# Patient Record
Sex: Female | Born: 1995
Health system: Southern US, Community
[De-identification: ages and names within clinical notes are randomized; demographics above are authoritative.]

## PROBLEM LIST (undated history)

## (undated) ENCOUNTER — Inpatient Hospital Stay (HOSPITAL_COMMUNITY): Payer: Self-pay

---

## 2016-04-09 LAB — OB RESULTS CONSOLE ABO/RH: RH TYPE: POSITIVE

## 2016-04-09 LAB — OB RESULTS CONSOLE RPR: RPR: NONREACTIVE

## 2016-04-09 LAB — OB RESULTS CONSOLE HIV ANTIBODY (ROUTINE TESTING): HIV: NONREACTIVE

## 2016-04-09 LAB — OB RESULTS CONSOLE GC/CHLAMYDIA
Chlamydia: NEGATIVE
Gonorrhea: NEGATIVE

## 2016-04-09 LAB — OB RESULTS CONSOLE RUBELLA ANTIBODY, IGM: Rubella: IMMUNE

## 2016-04-09 LAB — OB RESULTS CONSOLE ANTIBODY SCREEN: Antibody Screen: NEGATIVE

## 2016-04-09 LAB — OB RESULTS CONSOLE HEPATITIS B SURFACE ANTIGEN: HEP B S AG: NEGATIVE

## 2016-06-09 DIAGNOSIS — Z3201 Encounter for pregnancy test, result positive: Secondary | ICD-10-CM | POA: Diagnosis not present

## 2016-06-09 DIAGNOSIS — Z6830 Body mass index (BMI) 30.0-30.9, adult: Secondary | ICD-10-CM | POA: Diagnosis not present

## 2016-06-09 DIAGNOSIS — Z124 Encounter for screening for malignant neoplasm of cervix: Secondary | ICD-10-CM | POA: Diagnosis not present

## 2016-06-09 DIAGNOSIS — N925 Other specified irregular menstruation: Secondary | ICD-10-CM | POA: Diagnosis not present

## 2016-06-09 DIAGNOSIS — Z3480 Encounter for supervision of other normal pregnancy, unspecified trimester: Secondary | ICD-10-CM | POA: Diagnosis not present

## 2016-06-09 DIAGNOSIS — Z01419 Encounter for gynecological examination (general) (routine) without abnormal findings: Secondary | ICD-10-CM | POA: Diagnosis not present

## 2016-06-30 DIAGNOSIS — Z36 Encounter for antenatal screening of mother: Secondary | ICD-10-CM | POA: Diagnosis not present

## 2016-07-28 DIAGNOSIS — Z3480 Encounter for supervision of other normal pregnancy, unspecified trimester: Secondary | ICD-10-CM | POA: Diagnosis not present

## 2016-08-24 DIAGNOSIS — Z363 Encounter for antenatal screening for malformations: Secondary | ICD-10-CM | POA: Diagnosis not present

## 2016-10-08 ENCOUNTER — Encounter (HOSPITAL_COMMUNITY): Payer: Self-pay | Admitting: *Deleted

## 2016-10-08 ENCOUNTER — Inpatient Hospital Stay (HOSPITAL_COMMUNITY)
Admission: AD | Admit: 2016-10-08 | Discharge: 2016-10-08 | Disposition: A | Discharge status: Home / Self Care | Payer: Medicaid Other | Source: Ambulatory Visit | Attending: Obstetrics | Admitting: Obstetrics

## 2016-10-08 DIAGNOSIS — R1011 Right upper quadrant pain: Secondary | ICD-10-CM

## 2016-10-08 DIAGNOSIS — O9989 Other specified diseases and conditions complicating pregnancy, childbirth and the puerperium: Secondary | ICD-10-CM | POA: Diagnosis not present

## 2016-10-08 DIAGNOSIS — Z3A26 26 weeks gestation of pregnancy: Secondary | ICD-10-CM | POA: Diagnosis not present

## 2016-10-08 DIAGNOSIS — O26892 Other specified pregnancy related conditions, second trimester: Secondary | ICD-10-CM | POA: Insufficient documentation

## 2016-10-08 DIAGNOSIS — Z87891 Personal history of nicotine dependence: Secondary | ICD-10-CM | POA: Diagnosis not present

## 2016-10-08 LAB — COMPREHENSIVE METABOLIC PANEL
ALK PHOS: 60 U/L (ref 38–126)
ALT: 24 U/L (ref 14–54)
ANION GAP: 6 (ref 5–15)
AST: 18 U/L (ref 15–41)
Albumin: 3.1 g/dL — ABNORMAL LOW (ref 3.5–5.0)
BILIRUBIN TOTAL: 0.2 mg/dL — AB (ref 0.3–1.2)
BUN: 14 mg/dL (ref 6–20)
CALCIUM: 8.9 mg/dL (ref 8.9–10.3)
CO2: 25 mmol/L (ref 22–32)
Chloride: 107 mmol/L (ref 101–111)
Creatinine, Ser: 0.62 mg/dL (ref 0.44–1.00)
GFR calc non Af Amer: >60 mL/min (ref >60)
GLUCOSE: 91 mg/dL (ref 65–99)
Potassium: 3.6 mmol/L (ref 3.5–5.1)
Sodium: 138 mmol/L (ref 135–145)
TOTAL PROTEIN: 6.7 g/dL (ref 6.5–8.1)

## 2016-10-08 LAB — URINALYSIS, ROUTINE W REFLEX MICROSCOPIC
Bilirubin Urine: NEGATIVE
GLUCOSE, UA: NEGATIVE mg/dL
Ketones, ur: NEGATIVE mg/dL
LEUKOCYTES UA: NEGATIVE
Nitrite: NEGATIVE
PH: 5.5 (ref 5.0–8.0)
Protein, ur: NEGATIVE mg/dL
Specific Gravity, Urine: 1.025 (ref 1.005–1.030)

## 2016-10-08 LAB — URINALYSIS, MICROSCOPIC (REFLEX)

## 2016-10-08 LAB — WET PREP, GENITAL
Clue Cells Wet Prep HPF POC: NONE SEEN
SPERM: NONE SEEN
TRICH WET PREP: NONE SEEN
Yeast Wet Prep HPF POC: NONE SEEN

## 2016-10-08 LAB — CBC
HEMATOCRIT: 30.5 % — AB (ref 36.0–46.0)
HEMOGLOBIN: 10.4 g/dL — AB (ref 12.0–15.0)
MCH: 27.5 pg (ref 26.0–34.0)
MCHC: 34.1 g/dL (ref 30.0–36.0)
MCV: 80.7 fL (ref 78.0–100.0)
Platelets: 372 10*3/uL (ref 150–400)
RBC: 3.78 MIL/uL — ABNORMAL LOW (ref 3.87–5.11)
RDW: 14 % (ref 11.5–15.5)
WBC: 11.2 10*3/uL — ABNORMAL HIGH (ref 4.0–10.5)

## 2016-10-08 MED ORDER — HYDROCODONE-ACETAMINOPHEN 5-325 MG PO TABS
2.0000 | ORAL_TABLET | Freq: Once | ORAL | Status: AC
  Administered 2016-10-08: 2 via ORAL
  Filled 2016-10-08: qty 2

## 2016-10-08 MED ORDER — ONDANSETRON 8 MG PO TBDP: 8.0000 mg | ORAL_TABLET | Freq: Three times a day (TID) | ORAL | 0 refills | Status: DC | PRN

## 2016-10-08 MED ORDER — ONDANSETRON 8 MG PO TBDP
8.0000 mg | ORAL_TABLET | Freq: Once | ORAL | Status: AC
  Administered 2016-10-08: 8 mg via ORAL
  Filled 2016-10-08: qty 1

## 2016-10-08 NOTE — Discharge Instructions (Signed)
Low-Fat Diet for  Gallbladder Conditions A low-fat diet can be helpful if you have pancreatitis or a gallbladder condition. With these conditions, your pancreas and gallbladder have trouble digesting fats. A healthy eating plan with less fat will help rest your pancreas and gallbladder and reduce your symptoms. What do I need to know about this diet?  Eat a low-fat diet.  Reduce your fat intake to less than 20-30% of your total daily calories. This is less than 50-60 g of fat per day.  Remember that you need some fat in your diet. Ask your dietician what your daily goal should be.  Choose nonfat and low-fat healthy foods. Look for the words nonfat, low fat, or fat free.  As a guide, look on the label and choose foods with less than 3 g of fat per serving. Eat only one serving.  Avoid alcohol.  Do not smoke. If you need help quitting, talk with your health care provider.  Eat small frequent meals instead of three large heavy meals. What foods can I eat? Grains  Include healthy grains and starches such as potatoes, wheat bread, fiber-rich cereal, and brown rice. Choose whole grain options whenever possible. In adults, whole grains should account for 45-65% of your daily calories. Fruits and Vegetables  Eat plenty of fruits and vegetables. Fresh fruits and vegetables add fiber to your diet. Meats and Other Protein Sources  Eat lean meat such as chicken and pork. Trim any fat off of meat before cooking it. Eggs, fish, and beans are other sources of protein. In adults, these foods should account for 10-35% of your daily calories. Dairy  Choose low-fat milk and dairy options. Dairy includes fat and protein, as well as calcium. Fats and Oils  Limit high-fat foods such as fried foods, sweets, baked goods, sugary drinks. Other  Creamy sauces and condiments, such as mayonnaise, can add extra fat. Think about whether or not you need to use them, or use smaller amounts or low fat  options. What foods are not recommended?  High fat foods, such as:  [employer] Corporation[employer]aked goods.  Ice cream.  [place] toast.  Sweet rolls.  Pizza.  Cheese bread.  Foods covered with batter, butter, creamy sauces, or cheese.  Fried foods.  Sugary drinks and desserts.  Foods that cause gas or bloating This information is not intended to replace advice given to you by your health care provider. Make sure you discuss any questions you have with your health care provider. Document Released: 10/16/2013 Document Revised: 03/18/2016 Document Reviewed: 09/24/2013 Elsevier Interactive Patient Education  2017 [employer]

## 2016-10-08 NOTE — MAU Provider Note (Signed)
History     CSN: [id number]  Arrival date and time: 10/08/16 [id number]   First Provider Initiated Contact with Patient 10/08/16 0302      Chief Complaint  Patient presents with  . Abdominal Pain   Rose Moyer is a 20 y.o. G1P0 at [id number] who presents today with RUQ pain that radiates to her back, the pain started just prior to arrival. She denies any VB or LOF. She confirms normal fetal movement. She states that around 0000 she ate chips and cheese that was the last thing she ate prior to the pain starting.    Abdominal Pain  This is a new problem. The current episode started today. The onset quality is sudden. The problem occurs constantly. The problem has been unchanged. The pain is located in the RUQ. The pain is at a severity of 6/10. The quality of the pain is cramping and sharp. The abdominal pain radiates to the back. Associated symptoms include nausea and vomiting. Pertinent negatives include no diarrhea, dysuria, fever or frequency. Nothing aggravates the pain. The pain is relieved by nothing. She has tried nothing for the symptoms.   History reviewed. No pertinent past medical history.  History reviewed. No pertinent surgical history.  History reviewed. No pertinent family history.  Social History  Substance Use Topics  . Smoking status: Former Games developermoker  . Smokeless tobacco: Never Used  . Alcohol use 0.6 oz/week    1 Cans of beer per week     Comment: DRANK YEARS  AGO     Allergies: No Known Allergies  No prescriptions prior to admission.    Review of Systems  Constitutional: Negative for chills and fever.  Gastrointestinal: Positive for abdominal pain, nausea and vomiting. Negative for diarrhea.  Genitourinary: Negative for dysuria, frequency and urgency.   Physical Exam   Blood pressure 137/89, pulse 105, temperature 98.2 F (36.8 C), temperature source Oral, resp. rate 20, height 5\' 5"  (1.651 m), weight 213 lb (96.6 kg).  Physical Exam  Nursing note and  vitals reviewed. Constitutional: She is oriented to person, place, and time. She appears well-developed and well-nourished. No distress.  HENT:  Head: Normocephalic.  Cardiovascular: Normal rate.   Respiratory: Effort normal.  GI: Soft. There is no tenderness. There is no rebound.  Genitourinary:  Genitourinary Comments: No CVA tenderness Cervix: closed/thick/ballotable    Neurological: She is alert and oriented to person, place, and time.  Skin: Skin is warm and dry.  Psychiatric: She has a normal mood and affect.   FHT: 145, moderate with 10x10 accels, no decels. Tracing is broken up some as patient has been moving about in the bed.  Toco: no UCs   Results for orders placed or performed during the hospital encounter of 10/08/16 (from the past 24 hour(s))  Urinalysis, Routine w reflex microscopic     Status: Abnormal   Collection Time: 10/08/16  2:45 AM  Result Value Ref Range   Color, Urine YELLOW YELLOW   APPearance HAZY (A) CLEAR   Specific Gravity, Urine 1.025 1.005 - 1.030   pH 5.5 5.0 - 8.0   Glucose, UA NEGATIVE NEGATIVE mg/dL   Hgb urine dipstick LARGE (A) NEGATIVE   Bilirubin Urine NEGATIVE NEGATIVE   Ketones, ur NEGATIVE NEGATIVE mg/dL   Protein, ur NEGATIVE NEGATIVE mg/dL   Nitrite NEGATIVE NEGATIVE   Leukocytes, UA NEGATIVE NEGATIVE  Urinalysis, Microscopic (reflex)     Status: Abnormal   Collection Time: 10/08/16  2:45 AM  Result Value Ref Range  RBC / HPF 6-30 0 - 5 RBC/hpf   WBC, UA 0-5 0 - 5 WBC/hpf   Bacteria, UA FEW (A) NONE SEEN   Squamous Epithelial / LPF 6-30 (A) NONE SEEN   Mucous PRESENT   CBC     Status: Abnormal   Collection Time: 10/08/16  3:13 AM  Result Value Ref Range   WBC 11.2 (H) 4.0 - 10.5 K/uL   RBC 3.78 (L) 3.87 - 5.11 MIL/uL   Hemoglobin 10.4 (L) 12.0 - 15.0 g/dL   HCT 16.130.5 (L) 09.636.0 - 04.546.0 %   MCV 80.7 78.0 - 100.0 fL   MCH 27.5 26.0 - 34.0 pg   MCHC 34.1 30.0 - 36.0 g/dL   RDW 40.914.0 81.111.5 - 91.415.5 %   Platelets 372 150 - 400 K/uL   Comprehensive metabolic panel     Status: Abnormal   Collection Time: 10/08/16  3:13 AM  Result Value Ref Range   Sodium 138 135 - 145 mmol/L   Potassium 3.6 3.5 - 5.1 mmol/L   Chloride 107 101 - 111 mmol/L   CO2 25 22 - 32 mmol/L   Glucose, Bld 91 65 - 99 mg/dL   BUN 14 6 - 20 mg/dL   Creatinine, Ser 7.820.62 0.44 - 1.00 mg/dL   Calcium 8.9 8.9 - 95.610.3 mg/dL   Total Protein 6.7 6.5 - 8.1 g/dL   Albumin 3.1 (L) 3.5 - 5.0 g/dL   AST 18 15 - 41 U/L   ALT 24 14 - 54 U/L   Alkaline Phosphatase 60 38 - 126 U/L   Total Bilirubin 0.2 (L) 0.3 - 1.2 mg/dL   GFR calc non Af Amer >60 >60 mL/min   GFR calc Af Amer >60 >60 mL/min   Anion gap 6 5 - 15  Wet prep, genital     Status: Abnormal   Collection Time: 10/08/16  3:50 AM  Result Value Ref Range   Yeast Wet Prep HPF POC NONE SEEN NONE SEEN   Trich, Wet Prep NONE SEEN NONE SEEN   Clue Cells Wet Prep HPF POC NONE SEEN NONE SEEN   WBC, Wet Prep HPF POC FEW (A) NONE SEEN   Sperm NONE SEEN     MAU Course  Procedures  MDM Patient has had zofran and vicodin. She reports that her pain is better. No further emesis.   [id number]: D/W Dr. Chestine Sporelark, ok for DC home with zofran as needed.   Assessment and Plan   1. RUQ abdominal pain   2. [redacted] weeks gestation of pregnancy    DC home Comfort measures reviewed  Low fat diet  3rd Trimester precautions  PTL precautions  Fetal kick counts RX: zofran PRN #[id number]  Return to MAU as needed FU with OB as planned  Follow-up Information    PINN, Sanjuana MaeWALDA STACIA, MD Follow up.   Specialty:  Obstetrics and Gynecology Contact information: [address] [place] [address] [phone]            Tawnya CrookHogan, [place] Donovan 10/08/2016, 3:03 AM

## 2016-10-08 NOTE — MAU Note (Signed)
PT  SAYS SHE  GETS [facility]   AT  [facility]-   DR  PINN.   SAYS  SHE FEELS RIGHT LOWER  ABD  PAIN - STARTED AT 0230.    LAST SEX-  NOV.

## 2016-10-09 LAB — URINE CULTURE

## 2016-10-11 ENCOUNTER — Inpatient Hospital Stay (HOSPITAL_COMMUNITY): Payer: Medicaid Other

## 2016-10-11 ENCOUNTER — Inpatient Hospital Stay (HOSPITAL_COMMUNITY)
Admission: AD | Admit: 2016-10-11 | Discharge: 2016-10-12 | Disposition: A | Discharge status: Home / Self Care | Payer: Medicaid Other | Source: Ambulatory Visit | Attending: Obstetrics and Gynecology | Admitting: Obstetrics and Gynecology

## 2016-10-11 ENCOUNTER — Encounter (HOSPITAL_COMMUNITY): Payer: Self-pay

## 2016-10-11 ENCOUNTER — Ambulatory Visit (HOSPITAL_COMMUNITY): Payer: Medicaid Other

## 2016-10-11 DIAGNOSIS — Z87891 Personal history of nicotine dependence: Secondary | ICD-10-CM | POA: Insufficient documentation

## 2016-10-11 DIAGNOSIS — N1330 Unspecified hydronephrosis: Secondary | ICD-10-CM | POA: Insufficient documentation

## 2016-10-11 DIAGNOSIS — Z3A27 27 weeks gestation of pregnancy: Secondary | ICD-10-CM | POA: Insufficient documentation

## 2016-10-11 DIAGNOSIS — R109 Unspecified abdominal pain: Secondary | ICD-10-CM | POA: Insufficient documentation

## 2016-10-11 DIAGNOSIS — R112 Nausea with vomiting, unspecified: Secondary | ICD-10-CM

## 2016-10-11 DIAGNOSIS — R1011 Right upper quadrant pain: Secondary | ICD-10-CM

## 2016-10-11 DIAGNOSIS — O2342 Unspecified infection of urinary tract in pregnancy, second trimester: Secondary | ICD-10-CM | POA: Diagnosis not present

## 2016-10-11 LAB — URINALYSIS, ROUTINE W REFLEX MICROSCOPIC
Bilirubin Urine: NEGATIVE
GLUCOSE, UA: NEGATIVE mg/dL
Hgb urine dipstick: NEGATIVE
KETONES UR: 5 mg/dL — AB
Nitrite: NEGATIVE
PROTEIN: 30 mg/dL — AB
Specific Gravity, Urine: 1.02 (ref 1.005–1.030)
pH: 7 (ref 5.0–8.0)

## 2016-10-11 LAB — COMPREHENSIVE METABOLIC PANEL
ALBUMIN: 3.2 g/dL — AB (ref 3.5–5.0)
ALT: 19 U/L (ref 14–54)
ANION GAP: 9 (ref 5–15)
AST: 18 U/L (ref 15–41)
Alkaline Phosphatase: 64 U/L (ref 38–126)
BILIRUBIN TOTAL: 0.3 mg/dL (ref 0.3–1.2)
BUN: 8 mg/dL (ref 6–20)
CO2: 23 mmol/L (ref 22–32)
Calcium: 9.3 mg/dL (ref 8.9–10.3)
Chloride: 105 mmol/L (ref 101–111)
Creatinine, Ser: 0.62 mg/dL (ref 0.44–1.00)
GFR calc Af Amer: >60 mL/min (ref >60)
GFR calc non Af Amer: >60 mL/min (ref >60)
GLUCOSE: 86 mg/dL (ref 65–99)
POTASSIUM: 4.2 mmol/L (ref 3.5–5.1)
SODIUM: 137 mmol/L (ref 135–145)
TOTAL PROTEIN: 6.9 g/dL (ref 6.5–8.1)

## 2016-10-11 LAB — GC/CHLAMYDIA PROBE AMP (~~LOC~~) NOT AT ARMC
Chlamydia: NEGATIVE
Neisseria Gonorrhea: NEGATIVE

## 2016-10-11 LAB — LIPASE, BLOOD: Lipase: 15 U/L (ref 11–51)

## 2016-10-11 LAB — CBC
HCT: 33.4 % — ABNORMAL LOW (ref 36.0–46.0)
HEMOGLOBIN: 11.3 g/dL — AB (ref 12.0–15.0)
MCH: 27.6 pg (ref 26.0–34.0)
MCHC: 33.8 g/dL (ref 30.0–36.0)
MCV: 81.7 fL (ref 78.0–100.0)
Platelets: 404 10*3/uL — ABNORMAL HIGH (ref 150–400)
RBC: 4.09 MIL/uL (ref 3.87–5.11)
RDW: 14 % (ref 11.5–15.5)
WBC: 13.3 10*3/uL — ABNORMAL HIGH (ref 4.0–10.5)

## 2016-10-11 LAB — PROTEIN / CREATININE RATIO, URINE
Creatinine, Urine: 174 mg/dL
PROTEIN CREATININE RATIO: 0.12 mg/mg{creat} (ref 0.00–0.15)
TOTAL PROTEIN, URINE: 21 mg/dL

## 2016-10-11 MED ORDER — CALCIUM CARBONATE ANTACID 500 MG PO CHEW: 2.0000 | CHEWABLE_TABLET | ORAL | Status: DC | PRN

## 2016-10-11 MED ORDER — PROMETHAZINE HCL 25 MG/ML IJ SOLN
12.5000 mg | INTRAMUSCULAR | Status: DC | PRN
Start: 2016-10-11 — End: 2016-10-12

## 2016-10-11 MED ORDER — ZOLPIDEM TARTRATE 5 MG PO TABS: 5.0000 mg | ORAL_TABLET | Freq: Every evening | ORAL | Status: DC | PRN

## 2016-10-11 MED ORDER — LACTATED RINGERS IV SOLN
INTRAVENOUS | Status: DC
  Administered 2016-10-11: 125 mL/h via INTRAVENOUS
  Administered 2016-10-12 (×2): via INTRAVENOUS

## 2016-10-11 MED ORDER — DOCUSATE SODIUM 100 MG PO CAPS
100.0000 mg | ORAL_CAPSULE | Freq: Every day | ORAL | Status: DC
  Administered 2016-10-12: 100 mg via ORAL
  Filled 2016-10-11 (×2): qty 1

## 2016-10-11 MED ORDER — ONDANSETRON HCL 4 MG/2ML IJ SOLN
4.0000 mg | Freq: Three times a day (TID) | INTRAMUSCULAR | Status: DC | PRN
  Filled 2016-10-11: qty 2

## 2016-10-11 MED ORDER — PRENATAL MULTIVITAMIN CH
1.0000 | ORAL_TABLET | Freq: Every day | ORAL | Status: DC
  Administered 2016-10-12: 1 via ORAL
  Filled 2016-10-11 (×2): qty 1

## 2016-10-11 MED ORDER — ONDANSETRON HCL 4 MG/2ML IJ SOLN: 4.0000 mg | Freq: Three times a day (TID) | INTRAMUSCULAR | Status: DC

## 2016-10-11 MED ORDER — LACTATED RINGERS IV BOLUS (SEPSIS)
500.0000 mL | Freq: Once | INTRAVENOUS | Status: AC
  Administered 2016-10-11: 500 mL via INTRAVENOUS

## 2016-10-11 MED ORDER — ACETAMINOPHEN 325 MG PO TABS: 650.0000 mg | ORAL_TABLET | ORAL | Status: DC | PRN

## 2016-10-11 MED ORDER — HYDROCODONE-ACETAMINOPHEN 5-325 MG PO TABS
1.0000 | ORAL_TABLET | ORAL | Status: DC | PRN
  Administered 2016-10-12 (×2): 1 via ORAL
  Filled 2016-10-11 (×2): qty 1

## 2016-10-11 MED ORDER — HYDROMORPHONE HCL 1 MG/ML IJ SOLN: 0.5000 mg | INTRAMUSCULAR | Status: DC | PRN

## 2016-10-11 MED ORDER — HYDROMORPHONE HCL 1 MG/ML IJ SOLN
1.0000 mg | Freq: Once | INTRAMUSCULAR | Status: AC
  Administered 2016-10-11: 1 mg via INTRAVENOUS
  Filled 2016-10-11: qty 1

## 2016-10-11 NOTE — MAU Note (Signed)
[facility]

## 2016-10-11 NOTE — MAU Note (Signed)
Unable to monitor baby d/t patient changing positions frequently.  CNM aware

## 2016-10-11 NOTE — H&P (Signed)
Rose Moyer is a 20 y.o. female presenting for tight sided abdominal pain  20 yo G1P0 @ 27+2 presents for evaluation of right sided abdominal pain. This is the second encounter this patient has had for the same complaint. The patient was previously seen on 10/08/16 for similar complaints. At that time she received 1 vicodin tablet and her pain resolved and she was discharged home. The patient reports that her pain returned the next day.  On presentation tonight the patient reports recurrent RUQ pain and radiates around her right side to her back and that she has been unable to keep anything down today and her . Upon further questioning, the patient localizes her pain to the peri-umbilical area. Initially, in MAU she was noted to be visibly distressed with discomfort and required IV dilaudid for pain control. Per NP exam, pt with guarding on exam OB History    Gravida Para Term Preterm AB Living   1             SAB TAB Ectopic Multiple Live Births                 History reviewed. No pertinent past medical history. History reviewed. No pertinent surgical history. Family History: family history is not on file. Social History:  reports that she has quit smoking. She has never used smokeless tobacco. She reports that she drinks about 0.6 oz of alcohol per week . She reports that she does not use drugs.     Maternal Diabetes: too early for screening Genetic Screening: Normal Maternal Ultrasounds/Referrals: Normal Fetal Ultrasounds or other Referrals:  None Maternal Substance Abuse:  No Significant Maternal Medications:  None Significant Maternal Lab Results:  None Other Comments:  None  ROS History Dilation: Closed Exam by:: J rasch NP Blood pressure 134/81, pulse 90, temperature 98.3 F (36.8 C), resp. rate 18, SpO2 99 %. Exam Physical Exam  Prenatal labs: ABO, Rh:  B pos Antibody:  neg Rubella:  Imm RPR:   NR HBsAg:   Neg HIV:   NR GBS:     Results for orders placed or  performed during the hospital encounter of 10/11/16 (from the past 48 hour(s))  Urinalysis, Routine w reflex microscopic     Status: Abnormal   Collection Time: 10/11/16  4:34 PM  Result Value Ref Range   Color, Urine YELLOW YELLOW   APPearance CLOUDY (A) CLEAR   Specific Gravity, Urine 1.020 1.005 - 1.030   pH 7.0 5.0 - 8.0   Glucose, UA NEGATIVE NEGATIVE mg/dL   Hgb urine dipstick NEGATIVE NEGATIVE   Bilirubin Urine NEGATIVE NEGATIVE   Ketones, ur 5 (A) NEGATIVE mg/dL   Protein, ur 30 (A) NEGATIVE mg/dL   Nitrite NEGATIVE NEGATIVE   Leukocytes, UA MODERATE (A) NEGATIVE   RBC / HPF 0-5 0 - 5 RBC/hpf   WBC, UA 6-30 0 - 5 WBC/hpf   Bacteria, UA MANY (A) NONE SEEN   Squamous Epithelial / LPF 6-30 (A) NONE SEEN   Mucous PRESENT   Protein / creatinine ratio, urine     Status: None   Collection Time: 10/11/16  4:34 PM  Result Value Ref Range   Creatinine, Urine 174.00 mg/dL   Total Protein, Urine 21 mg/dL    Comment: NO NORMAL RANGE ESTABLISHED FOR THIS TEST   Protein Creatinine Ratio 0.12 0.00 - 0.15 mg/mg[Cre]  Lipase, blood     Status: None   Collection Time: 10/11/16  4:47 PM  Result Value Ref Range  Lipase 15 11 - 51 U/L  Comprehensive metabolic panel     Status: Abnormal   Collection Time: 10/11/16  4:47 PM  Result Value Ref Range   Sodium 137 135 - 145 mmol/L   Potassium 4.2 3.5 - 5.1 mmol/L   Chloride 105 101 - 111 mmol/L   CO2 23 22 - 32 mmol/L   Glucose, Bld 86 65 - 99 mg/dL   BUN 8 6 - 20 mg/dL   Creatinine, Ser 0.62 0.44 - 1.00 mg/dL   Calcium 9.3 8.9 - 10.3 mg/dL   Total Protein 6.9 6.5 - 8.1 g/dL   Albumin 3.2 (L) 3.5 - 5.0 g/dL   AST 18 15 - 41 U/L   ALT 19 14 - 54 U/L   Alkaline Phosphatase 64 38 - 126 U/L   Total Bilirubin 0.3 0.3 - 1.2 mg/dL   GFR calc non Af Amer >60 >60 mL/min   GFR calc Af Amer >60 >60 mL/min    Comment: (NOTE) The eGFR has been calculated using the CKD EPI equation. This calculation has not been validated in all clinical  situations. eGFR's persistently <60 mL/min signify possible Chronic Kidney Disease.    Anion gap 9 5 - 15  CBC     Status: Abnormal   Collection Time: 10/11/16  4:47 PM  Result Value Ref Range   WBC 13.3 (H) 4.0 - 10.5 K/uL   RBC 4.09 3.87 - 5.11 MIL/uL   Hemoglobin 11.3 (L) 12.0 - 15.0 g/dL   HCT 33.4 (L) 36.0 - 46.0 %   MCV 81.7 78.0 - 100.0 fL   MCH 27.6 26.0 - 34.0 pg   MCHC 33.8 30.0 - 36.0 g/dL   RDW 14.0 11.5 - 15.5 %   Platelets 404 (H) 150 - 400 K/uL   Renal US: WNL, mild hydronephrosis RUQ [place]: WNL Pelvic MRI: Normal liver, gallbladder, kidneys, mild hydronephrosis. Appendix not discretely visualized, however no inflammatory bowel process noted  Assessment/Plan: 20yo G1P0 @ 27+2 with abdominal pain 1) Admit: Given this is patient's 2nd presentation will keep for 23 hour obs. Imaging to this point has been unremarkable and non-diagnostic.  2) Repeat labs in AM 3) Antiemetics prn 4) Continue IVF hydration 5) Pain medication available. Will determine amount of medication patient requires overnight for pain control. Patient was medicated prior to being sent for MRI. She has not required additional pain medication since return  6) Sent urine culture in case atypical presentation of UTI  Daemian Gahm H. 10/11/2016, 11:44 PM

## 2016-10-11 NOTE — MAU Provider Note (Signed)
History     CSN: [id number]  Arrival date and time: 10/11/16 1555   First Provider Initiated Contact with Patient 10/11/16 1634      Chief Complaint  Patient presents with  . Abdominal Pain  . Emesis   HPI   Rose Moyer is a 20 y.o. female G1P0 @ 40w2dhere in MAU with RUQ pain, nausea and vomiting. The symptoms started 2 days ago and she was seen for this here in the MAU. She was given Zofran and percocet and felt better. She was discharged home with antiemetics and feels like this helped. The pain returned yesterday and today she vomited 6x. She has not had anything to eat today. The pain is in her RUQ, sometimes in the middle of her right side and radiates around to the right upper side of her back. The pain is constant. She had a hard time even standing up straight today and yesterday. The pain interfered with her daily activities.   She denies urinary symptoms. She denies fever. The pain improves when she lays on her right side. She ultimately feels like she cannot get fully comfortable.    OB History    Gravida Para Term Preterm AB Living   1             SAB TAB Ectopic Multiple Live Births                  History reviewed. No pertinent past medical history.  History reviewed. No pertinent surgical history.  History reviewed. No pertinent family history.  Social History  Substance Use Topics  . Smoking status: Former SResearch scientist (life sciences) . Smokeless tobacco: Never Used  . Alcohol use 0.6 oz/week    1 Cans of beer per week     Comment: DRANK YEARS  AGO     Allergies: No Known Allergies  Prescriptions Prior to Admission  Medication Sig Dispense Refill Last Dose  . ondansetron (ZOFRAN ODT) 8 MG disintegrating tablet Take 1 tablet (8 mg total) by mouth every 8 (eight) hours as needed for nausea or vomiting. 20 tablet 0    Results for orders placed or performed during the hospital encounter of 10/11/16 (from the past 48 hour(s))  Urinalysis, Routine w reflex  microscopic     Status: Abnormal   Collection Time: 10/11/16  4:34 PM  Result Value Ref Range   Color, Urine YELLOW YELLOW   APPearance CLOUDY (A) CLEAR   Specific Gravity, Urine 1.020 1.005 - 1.030   pH 7.0 5.0 - 8.0   Glucose, UA NEGATIVE NEGATIVE mg/dL   Hgb urine dipstick NEGATIVE NEGATIVE   Bilirubin Urine NEGATIVE NEGATIVE   Ketones, ur 5 (A) NEGATIVE mg/dL   Protein, ur 30 (A) NEGATIVE mg/dL   Nitrite NEGATIVE NEGATIVE   Leukocytes, UA MODERATE (A) NEGATIVE   RBC / HPF 0-5 0 - 5 RBC/hpf   WBC, UA 6-30 0 - 5 WBC/hpf   Bacteria, UA MANY (A) NONE SEEN   Squamous Epithelial / LPF 6-30 (A) NONE SEEN   Mucous PRESENT   Protein / creatinine ratio, urine     Status: None   Collection Time: 10/11/16  4:34 PM  Result Value Ref Range   Creatinine, Urine 174.00 mg/dL   Total Protein, Urine 21 mg/dL    Comment: NO NORMAL RANGE ESTABLISHED FOR THIS TEST   Protein Creatinine Ratio 0.12 0.00 - 0.15 mg/mg[Cre]  Lipase, blood     Status: None   Collection Time: 10/11/16  4:47 PM  Result Value Ref Range   Lipase 15 11 - 51 U/L  Comprehensive metabolic panel     Status: Abnormal   Collection Time: 10/11/16  4:47 PM  Result Value Ref Range   Sodium 137 135 - 145 mmol/L   Potassium 4.2 3.5 - 5.1 mmol/L   Chloride 105 101 - 111 mmol/L   CO2 23 22 - 32 mmol/L   Glucose, Bld 86 65 - 99 mg/dL   BUN 8 6 - 20 mg/dL   Creatinine, Ser 0.62 0.44 - 1.00 mg/dL   Calcium 9.3 8.9 - 10.3 mg/dL   Total Protein 6.9 6.5 - 8.1 g/dL   Albumin 3.2 (L) 3.5 - 5.0 g/dL   AST 18 15 - 41 U/L   ALT 19 14 - 54 U/L   Alkaline Phosphatase 64 38 - 126 U/L   Total Bilirubin 0.3 0.3 - 1.2 mg/dL   GFR calc non Af Amer >60 >60 mL/min   GFR calc Af Amer >60 >60 mL/min    Comment: (NOTE) The eGFR has been calculated using the CKD EPI equation. This calculation has not been validated in all clinical situations. eGFR's persistently <60 mL/min signify possible Chronic Kidney Disease.    Anion gap 9 5 - 15  CBC      Status: Abnormal   Collection Time: 10/11/16  4:47 PM  Result Value Ref Range   WBC 13.3 (H) 4.0 - 10.5 K/uL   RBC 4.09 3.87 - 5.11 MIL/uL   Hemoglobin 11.3 (L) 12.0 - 15.0 g/dL   HCT 33.4 (L) 36.0 - 46.0 %   MCV 81.7 78.0 - 100.0 fL   MCH 27.6 26.0 - 34.0 pg   MCHC 33.8 30.0 - 36.0 g/dL   RDW 14.0 11.5 - 15.5 %   Platelets 404 (H) 150 - 400 K/uL    US Renal  Result Date: 10/11/2016 CLINICAL DATA:  Right upper abdominal and back pain x4 days. Pregnant. EXAM: RENAL / URINARY TRACT ULTRASOUND COMPLETE COMPARISON:  None. FINDINGS: Right Kidney: Length: 13.2 cm. Echogenicity within normal limits. Mild hydronephrosis. Left Kidney: Length: 12.5 cm.  No mass.  Mild hydronephrosis. Bladder: Incompletely distended. IMPRESSION: 1. Mild bilateral hydronephrosis, which can be physiologic in the setting of Third trimester pregnancy. Electronically Signed   By: Lucrezia Europe M.D.   On: 10/11/2016 19:02   US Abdomen Limited Ruq  Result Date: 10/11/2016 CLINICAL DATA:  Right upper abdominal pain, nausea, vomiting. Pregnant. EXAM: US ABDOMEN LIMITED - RIGHT UPPER QUADRANT COMPARISON:  None. FINDINGS: Gallbladder: No gallstones or wall thickening visualized. No sonographic Murphy sign noted by sonographer. Common bile duct: Diameter: Liver: No focal lesion identified. Within normal limits in parenchymal echogenicity. IMPRESSION: Negative Electronically Signed   By: Lucrezia Europe M.D.   On: 10/11/2016 19:01    Review of Systems  Eyes: Negative for blurred vision.  Gastrointestinal: Positive for abdominal pain, nausea and vomiting.  Neurological: Negative for headaches.   Physical Exam   Blood pressure 133/72, pulse 93, temperature 98.3 F (36.8 C), resp. rate 18.   Patient Vitals for the past 24 hrs:  BP Temp Pulse Resp SpO2  10/11/16 1924 - - 90 - 99 %  10/11/16 1731 134/81 - 82 - -  10/11/16 1716 119/79 - 95 - -  10/11/16 1701 121/75 - 92 - -  10/11/16 1646 146/97 - 95 - -  10/11/16 1631 133/73 -  93 - -  10/11/16 1628 133/72 - 93 - -  10/11/16  1619 133/75 - 90 - -  10/11/16 1614 142/95 98.3 F (36.8 C) 88 18 -    Physical Exam  Constitutional: She is oriented to person, place, and time. She appears well-developed and well-nourished.  GI: There is tenderness in the right upper quadrant and periumbilical area. There is guarding. There is no rigidity and no rebound.  Genitourinary:  Genitourinary Comments: Dilation: Closed Exam by:: J rasch NP  Musculoskeletal: Normal range of motion.  Neurological: She is alert and oriented to person, place, and time.  Skin: Skin is warm. She is not diaphoretic.  Psychiatric: Her behavior is normal.   Fetal Tracing: Baseline: 135 Variability: moderate  Accelerations: 10x10 Decelerations: one variable decel noted, occasional quick variables  Toco: quiet  Mr Pelvis Wo Contrast  Result Date: 10/11/2016 CLINICAL DATA:  Right upper quadrant pain, nausea and vomiting. Two days duration. EXAM: MRI ABDOMEN WITHOUT CONTRAST TECHNIQUE: Multiplanar multisequence MR imaging was performed without the administration of intravenous contrast. COMPARISON:  Ultrasound 10/11/2016 FINDINGS: Lower chest: No significant abnormality. Hepatobiliary: The liver, gallbladder and bile ducts appear normal. Pancreas: No mass, inflammatory changes, or other parenchymal abnormality identified. Spleen:  Within normal limits in size and appearance. Adrenals/Urinary Tract: Mild hydronephrosis, right greater than left. No perinephric or periureteral inflammatory changes. The hydronephrosis is probably physiologic. Adrenals are unremarkable. Urinary bladder is unremarkable. Stomach/Bowel: Visualized portions within the abdomen are unremarkable. No evidence of an acute inflammatory process. Vascular/Lymphatic: No pathologically enlarged lymph nodes identified. Abdominal aorta is normal. Other:  None Musculoskeletal: No suspicious bone lesions identified. IMPRESSION: Unremarkable liver,  gallbladder and bile ducts. Mild hydronephrosis, right greater than left. This probably is physiologic. Visible bowel is unremarkable. Appendix is not discretely visible but no acute inflammatory process is evident. Electronically Signed   By: Andreas Newport M.D.   On: 10/11/2016 23:06   Mr Abdomen Wo Contrast  Result Date: 10/11/2016 CLINICAL DATA:  Right upper quadrant pain, nausea and vomiting. Two days duration. EXAM: MRI ABDOMEN WITHOUT CONTRAST TECHNIQUE: Multiplanar multisequence MR imaging was performed without the administration of intravenous contrast. COMPARISON:  Ultrasound 10/11/2016 FINDINGS: Lower chest: No significant abnormality. Hepatobiliary: The liver, gallbladder and bile ducts appear normal. Pancreas: No mass, inflammatory changes, or other parenchymal abnormality identified. Spleen:  Within normal limits in size and appearance. Adrenals/Urinary Tract: Mild hydronephrosis, right greater than left. No perinephric or periureteral inflammatory changes. The hydronephrosis is probably physiologic. Adrenals are unremarkable. Urinary bladder is unremarkable. Stomach/Bowel: Visualized portions within the abdomen are unremarkable. No evidence of an acute inflammatory process. Vascular/Lymphatic: No pathologically enlarged lymph nodes identified. Abdominal aorta is normal. Other:  None Musculoskeletal: No suspicious bone lesions identified. IMPRESSION: Unremarkable liver, gallbladder and bile ducts. Mild hydronephrosis, right greater than left. This probably is physiologic. Visible bowel is unremarkable. Appendix is not discretely visible but no acute inflammatory process is evident. Electronically Signed   By: Andreas Newport M.D.   On: 10/11/2016 23:06   US Renal  Result Date: 10/11/2016 CLINICAL DATA:  Right upper abdominal and back pain x4 days. Pregnant. EXAM: RENAL / URINARY TRACT ULTRASOUND COMPLETE COMPARISON:  None. FINDINGS: Right Kidney: Length: 13.2 cm. Echogenicity within  normal limits. Mild hydronephrosis. Left Kidney: Length: 12.5 cm.  No mass.  Mild hydronephrosis. Bladder: Incompletely distended. IMPRESSION: 1. Mild bilateral hydronephrosis, which can be physiologic in the setting of Third trimester pregnancy. Electronically Signed   By: Lucrezia Europe M.D.   On: 10/11/2016 19:02   US Abdomen Limited Ruq  Result Date: 10/11/2016 CLINICAL DATA:  Right upper abdominal pain, nausea, vomiting. Pregnant. EXAM: US ABDOMEN LIMITED - RIGHT UPPER QUADRANT COMPARISON:  None. FINDINGS: Gallbladder: No gallstones or wall thickening visualized. No sonographic Murphy sign noted by sonographer. Common bile duct: Diameter: Liver: No focal lesion identified. Within normal limits in parenchymal echogenicity. IMPRESSION: Negative Electronically Signed   By: Lucrezia Europe M.D.   On: 10/11/2016 19:01   MAU Course  Procedures  None  MDM  BP elevated on arrival. Elevated Diastolic on 30/13. [place] labs ordered  NST reactive, no contractions  LR Dilaudid 1 mg IV Pain down from 6/10 to 3/10 Renal US & Right upper quad [place] CBC  Lipase  Urine culture sent  Discussed HPI, Labs, and US findings with Dr. Harrington Challenger @ 1930. Will send patient for MRI to evaluate for acute appendicitis.  Report given to Marcille Buffy CNM who resumes care of the patient.  2334: D/W Dr. Harrington Challenger, will obs for pain control  D/W NICU they are aware of admission. Very low likelihood of labor. OK with admission  Assessment and Plan  Right sided abdominal pain  Will 23 OBS for pain control

## 2016-10-11 NOTE — MAU Note (Signed)
[facility]

## 2016-10-11 NOTE — MAU Note (Addendum)
Pt has still been having RUQ pain and vomiting. No diarrhea, no bleeding or LOF. Did try to take Tylenol this morning but may have thrown it up.

## 2016-10-12 ENCOUNTER — Encounter (HOSPITAL_COMMUNITY): Payer: Self-pay | Admitting: *Deleted

## 2016-10-12 DIAGNOSIS — O2342 Unspecified infection of urinary tract in pregnancy, second trimester: Secondary | ICD-10-CM | POA: Diagnosis not present

## 2016-10-12 DIAGNOSIS — R1011 Right upper quadrant pain: Secondary | ICD-10-CM | POA: Diagnosis not present

## 2016-10-12 DIAGNOSIS — Z87891 Personal history of nicotine dependence: Secondary | ICD-10-CM | POA: Diagnosis not present

## 2016-10-12 DIAGNOSIS — Z3A27 27 weeks gestation of pregnancy: Secondary | ICD-10-CM | POA: Diagnosis not present

## 2016-10-12 DIAGNOSIS — N1330 Unspecified hydronephrosis: Secondary | ICD-10-CM | POA: Diagnosis not present

## 2016-10-12 LAB — COMPREHENSIVE METABOLIC PANEL
ALT: 18 U/L (ref 14–54)
AST: 20 U/L (ref 15–41)
Albumin: 2.9 g/dL — ABNORMAL LOW (ref 3.5–5.0)
Alkaline Phosphatase: 59 U/L (ref 38–126)
Anion gap: 6 (ref 5–15)
BUN: 9 mg/dL (ref 6–20)
CHLORIDE: 103 mmol/L (ref 101–111)
CO2: 25 mmol/L (ref 22–32)
CREATININE: 0.89 mg/dL (ref 0.44–1.00)
Calcium: 8.4 mg/dL — ABNORMAL LOW (ref 8.9–10.3)
GFR calc Af Amer: >60 mL/min (ref >60)
Glucose, Bld: 79 mg/dL (ref 65–99)
POTASSIUM: 3.7 mmol/L (ref 3.5–5.1)
Sodium: 134 mmol/L — ABNORMAL LOW (ref 135–145)
TOTAL PROTEIN: 6.1 g/dL — AB (ref 6.5–8.1)
Total Bilirubin: 0.5 mg/dL (ref 0.3–1.2)

## 2016-10-12 LAB — CBC
HEMATOCRIT: 27 % — AB (ref 36.0–46.0)
Hemoglobin: 9.2 g/dL — ABNORMAL LOW (ref 12.0–15.0)
MCH: 27.6 pg (ref 26.0–34.0)
MCHC: 34.1 g/dL (ref 30.0–36.0)
MCV: 81.1 fL (ref 78.0–100.0)
PLATELETS: 327 10*3/uL (ref 150–400)
RBC: 3.33 MIL/uL — ABNORMAL LOW (ref 3.87–5.11)
RDW: 13.9 % (ref 11.5–15.5)
WBC: 12.9 10*3/uL — AB (ref 4.0–10.5)

## 2016-10-12 MED ORDER — CEFUROXIME AXETIL 250 MG PO TABS
250.0000 mg | ORAL_TABLET | Freq: Two times a day (BID) | ORAL | Status: DC
  Administered 2016-10-12 (×2): 250 mg via ORAL
  Filled 2016-10-12 (×3): qty 1

## 2016-10-12 MED ORDER — CEFUROXIME AXETIL 250 MG PO TABS: 250.0000 mg | ORAL_TABLET | Freq: Two times a day (BID) | ORAL | 0 refills | Status: AC

## 2016-10-12 MED ORDER — HYDROCODONE-ACETAMINOPHEN 5-325 MG PO TABS: 1.0000 | ORAL_TABLET | ORAL | 0 refills | Status: DC | PRN

## 2016-10-12 NOTE — Progress Notes (Signed)
20 y.o. G1P0 4855w3d HD#0 admitted for abdominal pain.  Pt currently stable with no c/o.  Good FM.  Vitals:   10/11/16 1731 10/11/16 1924 10/12/16 0022 10/12/16 0041  BP: 134/81  115/68   Pulse: 82 90 (!) 103   Resp:   20   Temp:   99.1 F (37.3 C)   TempSrc:   Oral   SpO2:  99% 98%   Weight:    96.6 kg (213 lb)  Height:    5\' 5"  (1.651 m)    Lungs CTA Cor RRR Abd  Soft, gravid, nontender Ex SCDs FHTs  Last night 130s, good short term variability, NST R Toco  occ  Results for orders placed or performed during the hospital encounter of 10/11/16 (from the past 24 hour(s))  Urinalysis, Routine w reflex microscopic     Status: Abnormal   Collection Time: 10/11/16  4:34 PM  Result Value Ref Range   Color, Urine YELLOW YELLOW   APPearance CLOUDY (A) CLEAR   Specific Gravity, Urine 1.020 1.005 - 1.030   pH 7.0 5.0 - 8.0   Glucose, UA NEGATIVE NEGATIVE mg/dL   Hgb urine dipstick NEGATIVE NEGATIVE   Bilirubin Urine NEGATIVE NEGATIVE   Ketones, ur 5 (A) NEGATIVE mg/dL   Protein, ur 30 (A) NEGATIVE mg/dL   Nitrite NEGATIVE NEGATIVE   Leukocytes, UA MODERATE (A) NEGATIVE   RBC / HPF 0-5 0 - 5 RBC/hpf   WBC, UA 6-30 0 - 5 WBC/hpf   Bacteria, UA MANY (A) NONE SEEN   Squamous Epithelial / LPF 6-30 (A) NONE SEEN   Mucous PRESENT   Protein / creatinine ratio, urine     Status: None   Collection Time: 10/11/16  4:34 PM  Result Value Ref Range   Creatinine, Urine 174.00 mg/dL   Total Protein, Urine 21 mg/dL   Protein Creatinine Ratio 0.12 0.00 - 0.15 mg/mg[Cre]  Lipase, blood     Status: None   Collection Time: 10/11/16  4:47 PM  Result Value Ref Range   Lipase 15 11 - 51 U/L  Comprehensive metabolic panel     Status: Abnormal   Collection Time: 10/11/16  4:47 PM  Result Value Ref Range   Sodium 137 135 - 145 mmol/L   Potassium 4.2 3.5 - 5.1 mmol/L   Chloride 105 101 - 111 mmol/L   CO2 23 22 - 32 mmol/L   Glucose, Bld 86 65 - 99 mg/dL   BUN 8 6 - 20 mg/dL   Creatinine, Ser  1.610.62 0.44 - 1.00 mg/dL   Calcium 9.3 8.9 - 09.610.3 mg/dL   Total Protein 6.9 6.5 - 8.1 g/dL   Albumin 3.2 (L) 3.5 - 5.0 g/dL   AST 18 15 - 41 U/L   ALT 19 14 - 54 U/L   Alkaline Phosphatase 64 38 - 126 U/L   Total Bilirubin 0.3 0.3 - 1.2 mg/dL   GFR calc non Af Amer >60 >60 mL/min   GFR calc Af Amer >60 >60 mL/min   Anion gap 9 5 - 15  CBC     Status: Abnormal   Collection Time: 10/11/16  4:47 PM  Result Value Ref Range   WBC 13.3 (H) 4.0 - 10.5 K/uL   RBC 4.09 3.87 - 5.11 MIL/uL   Hemoglobin 11.3 (L) 12.0 - 15.0 g/dL   HCT 04.533.4 (L) 40.936.0 - 81.146.0 %   MCV 81.7 78.0 - 100.0 fL   MCH 27.6 26.0 - 34.0 pg   MCHC  33.8 30.0 - 36.0 g/dL   RDW 95.614.0 21.311.5 - 08.615.5 %   Platelets 404 (H) 150 - 400 K/uL  Comprehensive metabolic panel     Status: Abnormal   Collection Time: 10/12/16  5:30 AM  Result Value Ref Range   Sodium 134 (L) 135 - 145 mmol/L   Potassium 3.7 3.5 - 5.1 mmol/L   Chloride 103 101 - 111 mmol/L   CO2 25 22 - 32 mmol/L   Glucose, Bld 79 65 - 99 mg/dL   BUN 9 6 - 20 mg/dL   Creatinine, Ser 5.780.89 0.44 - 1.00 mg/dL   Calcium 8.4 (L) 8.9 - 10.3 mg/dL   Total Protein 6.1 (L) 6.5 - 8.1 g/dL   Albumin 2.9 (L) 3.5 - 5.0 g/dL   AST 20 15 - 41 U/L   ALT 18 14 - 54 U/L   Alkaline Phosphatase 59 38 - 126 U/L   Total Bilirubin 0.5 0.3 - 1.2 mg/dL   GFR calc non Af Amer >60 >60 mL/min   GFR calc Af Amer >60 >60 mL/min   Anion gap 6 5 - 15  CBC     Status: Abnormal   Collection Time: 10/12/16  5:30 AM  Result Value Ref Range   WBC 12.9 (H) 4.0 - 10.5 K/uL   RBC 3.33 (L) 3.87 - 5.11 MIL/uL   Hemoglobin 9.2 (L) 12.0 - 15.0 g/dL   HCT 46.927.0 (L) 62.936.0 - 52.846.0 %   MCV 81.1 78.0 - 100.0 fL   MCH 27.6 26.0 - 34.0 pg   MCHC 34.1 30.0 - 36.0 g/dL   RDW 41.313.9 24.411.5 - 01.015.5 %   Platelets 327 150 - 400 K/uL    A:  HD#0  9034w3d with abdominal pain. 1. MRI was unremarkable.  Appy not seen but no inflamatory process.  Mild hydronephrosis R>L. 2. WBC only slightly elevated, probable related to stress  reaction. 3.  UA did have WBC in it- culture pending, will treat presumptively. 4.  Afeb o/n. 5.  All other labs and tests were normal.  NST is reassuring.  No vaginal bleeding or contractions.  P: Treat for presumptive UTI.  Pt also has some mild hydronephrosis related to pregnancy and is possible cause of pain.  Plan on d/c if continues to tolerate PO.   Will send home with just a few percocet for pain.   Randi Poullard A

## 2016-10-12 NOTE — Discharge Summary (Signed)
Physician Discharge Summary  Patient ID: Rose Moyer MRN: [id number] DOB/AGE: 14-Nov-1995 20 y.o.  Admit date: 10/11/2016 Discharge date: 10/12/2016  Admission Diagnoses:abdominal pain, RUQ  Discharge Diagnoses: same, possible UTI Active Problems:   * No active hospital problems. *   Discharged Condition: good  Hospital Course: Pt admitted for obs for RUQ abdominal pain.  MRI, [place] and labs were all normal except for WBC on UA.   Possible irritation of ureters with some mild hydronephrosis.  Fetal wellbeing documented and cervix closed without contractions.  Pt d/ced with antibiotics for UTI and norco for pain.    Consults: None  Significant Diagnostic Studies: labs:  Results for orders placed or performed during the hospital encounter of 10/11/16 (from the past 48 hour(s))  Urinalysis, Routine w reflex microscopic     Status: Abnormal   Collection Time: 10/11/16  4:34 PM  Result Value Ref Range   Color, Urine YELLOW YELLOW   APPearance CLOUDY (A) CLEAR   Specific Gravity, Urine 1.020 1.005 - 1.030   pH 7.0 5.0 - 8.0   Glucose, UA NEGATIVE NEGATIVE mg/dL   Hgb urine dipstick NEGATIVE NEGATIVE   Bilirubin Urine NEGATIVE NEGATIVE   Ketones, ur 5 (A) NEGATIVE mg/dL   Protein, ur 30 (A) NEGATIVE mg/dL   Nitrite NEGATIVE NEGATIVE   Leukocytes, UA MODERATE (A) NEGATIVE   RBC / HPF 0-5 0 - 5 RBC/hpf   WBC, UA 6-30 0 - 5 WBC/hpf   Bacteria, UA MANY (A) NONE SEEN   Squamous Epithelial / LPF 6-30 (A) NONE SEEN   Mucous PRESENT   Protein / creatinine ratio, urine     Status: None   Collection Time: 10/11/16  4:34 PM  Result Value Ref Range   Creatinine, Urine 174.00 mg/dL   Total Protein, Urine 21 mg/dL    Comment: NO NORMAL RANGE ESTABLISHED FOR THIS TEST   Protein Creatinine Ratio 0.12 0.00 - 0.15 mg/mg[Cre]  Lipase, blood     Status: None   Collection Time: 10/11/16  4:47 PM  Result Value Ref Range   Lipase 15 11 - 51 U/L  Comprehensive metabolic panel     Status:  Abnormal   Collection Time: 10/11/16  4:47 PM  Result Value Ref Range   Sodium 137 135 - 145 mmol/L   Potassium 4.2 3.5 - 5.1 mmol/L   Chloride 105 101 - 111 mmol/L   CO2 23 22 - 32 mmol/L   Glucose, Bld 86 65 - 99 mg/dL   BUN 8 6 - 20 mg/dL   Creatinine, Ser 0.62 0.44 - 1.00 mg/dL   Calcium 9.3 8.9 - 10.3 mg/dL   Total Protein 6.9 6.5 - 8.1 g/dL   Albumin 3.2 (L) 3.5 - 5.0 g/dL   AST 18 15 - 41 U/L   ALT 19 14 - 54 U/L   Alkaline Phosphatase 64 38 - 126 U/L   Total Bilirubin 0.3 0.3 - 1.2 mg/dL   GFR calc non Af Amer >60 >60 mL/min   GFR calc Af Amer >60 >60 mL/min    Comment: (NOTE) The eGFR has been calculated using the CKD EPI equation. This calculation has not been validated in all clinical situations. eGFR's persistently <60 mL/min signify possible Chronic Kidney Disease.    Anion gap 9 5 - 15  CBC     Status: Abnormal   Collection Time: 10/11/16  4:47 PM  Result Value Ref Range   WBC 13.3 (H) 4.0 - 10.5 K/uL   RBC 4.09  3.87 - 5.11 MIL/uL   Hemoglobin 11.3 (L) 12.0 - 15.0 g/dL   HCT 33.4 (L) 36.0 - 46.0 %   MCV 81.7 78.0 - 100.0 fL   MCH 27.6 26.0 - 34.0 pg   MCHC 33.8 30.0 - 36.0 g/dL   RDW 14.0 11.5 - 15.5 %   Platelets 404 (H) 150 - 400 K/uL  Comprehensive metabolic panel     Status: Abnormal   Collection Time: 10/12/16  5:30 AM  Result Value Ref Range   Sodium 134 (L) 135 - 145 mmol/L   Potassium 3.7 3.5 - 5.1 mmol/L   Chloride 103 101 - 111 mmol/L   CO2 25 22 - 32 mmol/L   Glucose, Bld 79 65 - 99 mg/dL   BUN 9 6 - 20 mg/dL   Creatinine, Ser 0.89 0.44 - 1.00 mg/dL   Calcium 8.4 (L) 8.9 - 10.3 mg/dL   Total Protein 6.1 (L) 6.5 - 8.1 g/dL   Albumin 2.9 (L) 3.5 - 5.0 g/dL   AST 20 15 - 41 U/L   ALT 18 14 - 54 U/L   Alkaline Phosphatase 59 38 - 126 U/L   Total Bilirubin 0.5 0.3 - 1.2 mg/dL   GFR calc non Af Amer >60 >60 mL/min   GFR calc Af Amer >60 >60 mL/min    Comment: (NOTE) The eGFR has been calculated using the CKD EPI equation. This calculation  has not been validated in all clinical situations. eGFR's persistently <60 mL/min signify possible Chronic Kidney Disease.    Anion gap 6 5 - 15  CBC     Status: Abnormal   Collection Time: 10/12/16  5:30 AM  Result Value Ref Range   WBC 12.9 (H) 4.0 - 10.5 K/uL   RBC 3.33 (L) 3.87 - 5.11 MIL/uL   Hemoglobin 9.2 (L) 12.0 - 15.0 g/dL   HCT 27.0 (L) 36.0 - 46.0 %   MCV 81.1 78.0 - 100.0 fL   MCH 27.6 26.0 - 34.0 pg   MCHC 34.1 30.0 - 36.0 g/dL   RDW 13.9 11.5 - 15.5 %   Platelets 327 150 - 400 K/uL    Treatments: IV hydration and analgesia: Vicodin  Discharge Exam: Blood pressure (!) 106/55, pulse (!) 105, temperature 98.3 F (36.8 C), temperature source Oral, resp. rate 20, height '5\' 5"'$  (1.651 m), weight 96.6 kg (213 lb), SpO2 97 %.   Disposition: 01-Home or Self Care  Discharge Instructions    Discharge activity:  Up to eat    Complete by:  As directed    Discharge diet:  No restrictions    Complete by:  As directed    Discharge instructions    Complete by:  As directed    Modified bedrest.  Refrain from intercourse.  Count baby's movements in 1 hour per day- if you don't get 6 in that hour, call.   Do not have sex or do anything that might make you have an orgasm    Complete by:  As directed    Fetal Kick Count:  Lie on our left side for one hour after a meal, and count the number of times your baby kicks.  If it is less than 5 times, get up, move around and drink some juice.  Repeat the test 30 minutes later.  If it is still less than 5 kicks in an hour, notify your doctor.    Complete by:  As directed    LABOR:  When conractions begin, you should  start to time them from the beginning of one contraction to the beginning  of the next.  When contractions are 5 - 10 minutes apart or less and have been regular for at least an hour, you should call your health care provider.    Complete by:  As directed    Notify physician for bleeding from the vagina    Complete by:  As  directed    Notify physician for blurring of vision or spots before the eyes    Complete by:  As directed    Notify physician for chills or fever    Complete by:  As directed    Notify physician for fainting spells, "black outs" or loss of consciousness    Complete by:  As directed    Notify physician for increase in vaginal discharge    Complete by:  As directed    Notify physician for leaking of fluid    Complete by:  As directed    Notify physician for pain or burning when urinating    Complete by:  As directed    Notify physician for pelvic pressure (sudden increase)    Complete by:  As directed    Notify physician for severe or continued nausea or vomiting    Complete by:  As directed    Notify physician for sudden gushing of fluid from the vagina (with or without continued leaking)    Complete by:  As directed    Notify physician for sudden, constant, or occasional abdominal pain    Complete by:  As directed    Notify physician if baby moving less than usual    Complete by:  As directed      Allergies as of 10/12/2016   No Known Allergies     Medication List    TAKE these medications   cefUROXime 250 MG tablet Commonly known as:  CEFTIN Take 1 tablet (250 mg total) by mouth 2 (two) times daily with a meal.   HYDROcodone-acetaminophen 5-325 MG tablet Commonly known as:  NORCO/VICODIN Take 1-2 tablets by mouth every 4 (four) hours as needed for moderate pain.   ondansetron 8 MG disintegrating tablet Commonly known as:  ZOFRAN ODT Take 1 tablet (8 mg total) by mouth every 8 (eight) hours as needed for nausea or vomiting.      [facility] Follow up in 1 week(s).   Contact information: [place] [address] [phone]           Signed: Daria Pastures 10/12/2016, 10:53 AM

## 2016-10-12 NOTE — Progress Notes (Signed)
Patient has been discharged to home via private vehicle with Mother. All questions answered, work excuse given and hard narcotic script. All personal belongings have left room with patient.

## 2016-10-14 LAB — CULTURE, OB URINE: Special Requests: NORMAL

## 2016-10-20 DIAGNOSIS — Z23 Encounter for immunization: Secondary | ICD-10-CM | POA: Diagnosis not present

## 2016-10-20 DIAGNOSIS — Z3480 Encounter for supervision of other normal pregnancy, unspecified trimester: Secondary | ICD-10-CM | POA: Diagnosis not present

## 2016-10-20 DIAGNOSIS — Z369 Encounter for antenatal screening, unspecified: Secondary | ICD-10-CM | POA: Diagnosis not present

## 2016-12-10 ENCOUNTER — Other Ambulatory Visit: Payer: Self-pay | Admitting: Obstetrics and Gynecology

## 2016-12-10 DIAGNOSIS — Z3480 Encounter for supervision of other normal pregnancy, unspecified trimester: Secondary | ICD-10-CM | POA: Diagnosis not present

## 2016-12-10 DIAGNOSIS — Z369 Encounter for antenatal screening, unspecified: Secondary | ICD-10-CM | POA: Diagnosis not present

## 2016-12-10 LAB — OB RESULTS CONSOLE GBS: GBS: POSITIVE

## 2016-12-23 DIAGNOSIS — O26849 Uterine size-date discrepancy, unspecified trimester: Secondary | ICD-10-CM | POA: Diagnosis not present

## 2017-01-05 ENCOUNTER — Encounter (HOSPITAL_COMMUNITY): Payer: Self-pay | Admitting: *Deleted

## 2017-01-05 ENCOUNTER — Other Ambulatory Visit: Payer: Self-pay | Admitting: Obstetrics & Gynecology

## 2017-01-06 ENCOUNTER — Inpatient Hospital Stay (HOSPITAL_COMMUNITY): Payer: Medicaid Other | Admitting: Anesthesiology

## 2017-01-06 ENCOUNTER — Encounter (HOSPITAL_COMMUNITY): Payer: Self-pay | Admitting: Anesthesiology

## 2017-01-06 ENCOUNTER — Encounter (HOSPITAL_COMMUNITY)
Admission: RE | Disposition: A | Discharge status: Home / Self Care | Payer: Self-pay | Source: Ambulatory Visit | Attending: Obstetrics & Gynecology

## 2017-01-06 ENCOUNTER — Inpatient Hospital Stay (HOSPITAL_COMMUNITY)
Admission: RE | Admit: 2017-01-06 | Discharge: 2017-01-10 | DRG: 765 | Disposition: A | Discharge status: Home / Self Care | Payer: Medicaid Other | Source: Ambulatory Visit | Attending: Obstetrics & Gynecology | Admitting: Obstetrics & Gynecology

## 2017-01-06 DIAGNOSIS — Z3A39 39 weeks gestation of pregnancy: Secondary | ICD-10-CM

## 2017-01-06 DIAGNOSIS — O9081 Anemia of the puerperium: Secondary | ICD-10-CM | POA: Diagnosis not present

## 2017-01-06 DIAGNOSIS — O321XX0 Maternal care for breech presentation, not applicable or unspecified: Principal | ICD-10-CM

## 2017-01-06 DIAGNOSIS — Z87891 Personal history of nicotine dependence: Secondary | ICD-10-CM

## 2017-01-06 DIAGNOSIS — O99214 Obesity complicating childbirth: Secondary | ICD-10-CM | POA: Diagnosis present

## 2017-01-06 DIAGNOSIS — D649 Anemia, unspecified: Secondary | ICD-10-CM | POA: Diagnosis not present

## 2017-01-06 DIAGNOSIS — O99824 Streptococcus B carrier state complicating childbirth: Secondary | ICD-10-CM | POA: Diagnosis present

## 2017-01-06 DIAGNOSIS — O321XX1 Maternal care for breech presentation, fetus 1: Secondary | ICD-10-CM | POA: Diagnosis not present

## 2017-01-06 DIAGNOSIS — Z6838 Body mass index (BMI) 38.0-38.9, adult: Secondary | ICD-10-CM

## 2017-01-06 DIAGNOSIS — O902 Hematoma of obstetric wound: Secondary | ICD-10-CM | POA: Diagnosis present

## 2017-01-06 LAB — CBC
HCT: 30.4 % — ABNORMAL LOW (ref 36.0–46.0)
Hemoglobin: 9.8 g/dL — ABNORMAL LOW (ref 12.0–15.0)
MCH: 24.6 pg — ABNORMAL LOW (ref 26.0–34.0)
MCHC: 32.2 g/dL (ref 30.0–36.0)
MCV: 76.4 fL — ABNORMAL LOW (ref 78.0–100.0)
PLATELETS: 449 10*3/uL — AB (ref 150–400)
RBC: 3.98 MIL/uL (ref 3.87–5.11)
RDW: 15.1 % (ref 11.5–15.5)
WBC: 10.5 10*3/uL (ref 4.0–10.5)

## 2017-01-06 LAB — HEMOGLOBIN AND HEMATOCRIT, BLOOD
HCT: 25.9 % — ABNORMAL LOW (ref 36.0–46.0)
HEMATOCRIT: 24.6 % — AB (ref 36.0–46.0)
HEMOGLOBIN: 8.1 g/dL — AB (ref 12.0–15.0)
HEMOGLOBIN: 8.4 g/dL — AB (ref 12.0–15.0)

## 2017-01-06 LAB — ABO/RH: ABO/RH(D): B POS

## 2017-01-06 LAB — PREPARE RBC (CROSSMATCH)

## 2017-01-06 SURGERY — Surgical Case
Anesthesia: Spinal

## 2017-01-06 MED ORDER — PHENYLEPHRINE 40 MCG/ML (10ML) SYRINGE FOR IV PUSH (FOR BLOOD PRESSURE SUPPORT)
PREFILLED_SYRINGE | INTRAVENOUS | Status: AC
  Filled 2017-01-06: qty 10

## 2017-01-06 MED ORDER — PHENYLEPHRINE HCL 10 MG/ML IJ SOLN
INTRAMUSCULAR | Status: DC | PRN
  Administered 2017-01-06: 80 ug via INTRAVENOUS

## 2017-01-06 MED ORDER — DIPHENHYDRAMINE HCL 25 MG PO CAPS: 25.0000 mg | ORAL_CAPSULE | ORAL | Status: DC | PRN

## 2017-01-06 MED ORDER — SIMETHICONE 80 MG PO CHEW
80.0000 mg | CHEWABLE_TABLET | ORAL | Status: DC | PRN
  Filled 2017-01-06: qty 1

## 2017-01-06 MED ORDER — MENTHOL 3 MG MT LOZG: 1.0000 | LOZENGE | OROMUCOSAL | Status: DC | PRN

## 2017-01-06 MED ORDER — SOD CITRATE-CITRIC ACID 500-334 MG/5ML PO SOLN
30.0000 mL | Freq: Once | ORAL | Status: AC
  Administered 2017-01-06: 30 mL via ORAL
  Filled 2017-01-06: qty 15

## 2017-01-06 MED ORDER — MORPHINE SULFATE (PF) 4 MG/ML IV SOLN
INTRAVENOUS | Status: AC
  Filled 2017-01-06: qty 1

## 2017-01-06 MED ORDER — CEFAZOLIN SODIUM-DEXTROSE 2-4 GM/100ML-% IV SOLN
2.0000 g | INTRAVENOUS | Status: AC
  Administered 2017-01-06: 2 g via INTRAVENOUS

## 2017-01-06 MED ORDER — ZOLPIDEM TARTRATE 5 MG PO TABS: 5.0000 mg | ORAL_TABLET | Freq: Every evening | ORAL | Status: DC | PRN

## 2017-01-06 MED ORDER — NALBUPHINE HCL 10 MG/ML IJ SOLN: 5.0000 mg | Freq: Once | INTRAMUSCULAR | Status: DC | PRN

## 2017-01-06 MED ORDER — METHYLERGONOVINE MALEATE 0.2 MG/ML IJ SOLN: 0.2000 mg | INTRAMUSCULAR | Status: DC | PRN

## 2017-01-06 MED ORDER — COCONUT OIL OIL: 1.0000 "application " | TOPICAL_OIL | Status: DC | PRN

## 2017-01-06 MED ORDER — ACETAMINOPHEN 325 MG PO TABS
650.0000 mg | ORAL_TABLET | ORAL | Status: DC | PRN
  Administered 2017-01-09 (×2): 650 mg via ORAL
  Filled 2017-01-06 (×2): qty 2

## 2017-01-06 MED ORDER — EPHEDRINE 5 MG/ML INJ
INTRAVENOUS | Status: AC
  Filled 2017-01-06: qty 10

## 2017-01-06 MED ORDER — NALOXONE HCL 2 MG/2ML IJ SOSY: 1.0000 ug/kg/h | PREFILLED_SYRINGE | INTRAVENOUS | Status: DC | PRN

## 2017-01-06 MED ORDER — METOCLOPRAMIDE HCL 5 MG/ML IJ SOLN
INTRAMUSCULAR | Status: DC | PRN
  Administered 2017-01-06: 10 mg via INTRAVENOUS

## 2017-01-06 MED ORDER — METHYLERGONOVINE MALEATE 0.2 MG PO TABS
0.2000 mg | ORAL_TABLET | ORAL | Status: DC | PRN
  Administered 2017-01-07: 0.2 mg via ORAL
  Filled 2017-01-06: qty 1

## 2017-01-06 MED ORDER — OXYTOCIN 10 UNIT/ML IJ SOLN
INTRAMUSCULAR | Status: AC
  Filled 2017-01-06: qty 4

## 2017-01-06 MED ORDER — TETANUS-DIPHTH-ACELL PERTUSSIS 5-2.5-18.5 LF-MCG/0.5 IM SUSP: 0.5000 mL | Freq: Once | INTRAMUSCULAR | Status: DC

## 2017-01-06 MED ORDER — DEXAMETHASONE SODIUM PHOSPHATE 4 MG/ML IJ SOLN
INTRAMUSCULAR | Status: AC
  Filled 2017-01-06: qty 1

## 2017-01-06 MED ORDER — SCOPOLAMINE 1 MG/3DAYS TD PT72
MEDICATED_PATCH | TRANSDERMAL | Status: AC
  Filled 2017-01-06: qty 1

## 2017-01-06 MED ORDER — EPHEDRINE SULFATE 50 MG/ML IJ SOLN
INTRAMUSCULAR | Status: DC | PRN
  Administered 2017-01-06: 5 mg via INTRAVENOUS

## 2017-01-06 MED ORDER — SODIUM CHLORIDE 0.9 % IR SOLN
Status: DC | PRN
  Administered 2017-01-06: 1000 mL

## 2017-01-06 MED ORDER — SIMETHICONE 80 MG PO CHEW
80.0000 mg | CHEWABLE_TABLET | Freq: Three times a day (TID) | ORAL | Status: DC
  Administered 2017-01-07 – 2017-01-10 (×7): 80 mg via ORAL
  Filled 2017-01-06 (×7): qty 1

## 2017-01-06 MED ORDER — KETOROLAC TROMETHAMINE 30 MG/ML IJ SOLN: 30.0000 mg | Freq: Four times a day (QID) | INTRAMUSCULAR | Status: AC | PRN

## 2017-01-06 MED ORDER — PHENYLEPHRINE 8 MG IN D5W 100 ML (0.08MG/ML) PREMIX OPTIME
INJECTION | INTRAVENOUS | Status: DC | PRN
Start: 2017-01-06 — End: 2017-01-06
  Administered 2017-01-06: 60 ug/min via INTRAVENOUS

## 2017-01-06 MED ORDER — DIPHENHYDRAMINE HCL 25 MG PO CAPS: 25.0000 mg | ORAL_CAPSULE | Freq: Four times a day (QID) | ORAL | Status: DC | PRN

## 2017-01-06 MED ORDER — FENTANYL CITRATE (PF) 100 MCG/2ML IJ SOLN
INTRAMUSCULAR | Status: DC | PRN
  Administered 2017-01-06: 10 ug via INTRATHECAL
  Administered 2017-01-06: 50 ug via INTRAVENOUS
  Administered 2017-01-06: 40 ug via INTRAVENOUS

## 2017-01-06 MED ORDER — CEFAZOLIN SODIUM-DEXTROSE 2-4 GM/100ML-% IV SOLN: 2.0000 g | Freq: Once | INTRAVENOUS | Status: DC

## 2017-01-06 MED ORDER — NALBUPHINE HCL 10 MG/ML IJ SOLN: 5.0000 mg | INTRAMUSCULAR | Status: DC | PRN

## 2017-01-06 MED ORDER — LACTATED RINGERS IV SOLN
INTRAVENOUS | Status: DC | PRN
  Administered 2017-01-06: 16:00:00 via INTRAVENOUS

## 2017-01-06 MED ORDER — MORPHINE SULFATE (PF) 0.5 MG/ML IJ SOLN
INTRAMUSCULAR | Status: DC | PRN
  Administered 2017-01-06: .2 mg via INTRATHECAL

## 2017-01-06 MED ORDER — LACTATED RINGERS IV SOLN
INTRAVENOUS | Status: DC
  Administered 2017-01-06: 50 mL/h via INTRAVENOUS
  Administered 2017-01-06 (×4): via INTRAVENOUS

## 2017-01-06 MED ORDER — SCOPOLAMINE 1 MG/3DAYS TD PT72
MEDICATED_PATCH | TRANSDERMAL | Status: DC | PRN
  Administered 2017-01-06: 1 via TRANSDERMAL

## 2017-01-06 MED ORDER — BUPIVACAINE HCL (PF) 0.25 % IJ SOLN
INTRAMUSCULAR | Status: AC
  Filled 2017-01-06: qty 30

## 2017-01-06 MED ORDER — BUPIVACAINE HCL (PF) 0.25 % IJ SOLN
INTRAMUSCULAR | Status: DC | PRN
  Administered 2017-01-06: 30 mL

## 2017-01-06 MED ORDER — MORPHINE SULFATE (PF) 4 MG/ML IV SOLN
1.0000 mg | INTRAVENOUS | Status: DC | PRN
Start: 2017-01-06 — End: 2017-01-06
  Administered 2017-01-06 (×2): 1 mg via INTRAVENOUS

## 2017-01-06 MED ORDER — MORPHINE SULFATE (PF) 0.5 MG/ML IJ SOLN
INTRAMUSCULAR | Status: AC
Start: 2017-01-06 — End: 2017-01-06
  Filled 2017-01-06: qty 10

## 2017-01-06 MED ORDER — ACETAMINOPHEN 500 MG PO TABS
1000.0000 mg | ORAL_TABLET | Freq: Once | ORAL | Status: AC
  Administered 2017-01-06: 1000 mg via ORAL
  Filled 2017-01-06: qty 2

## 2017-01-06 MED ORDER — SODIUM CHLORIDE 0.9 % IV SOLN
INTRAVENOUS | Status: DC | PRN
  Administered 2017-01-06: 40 ug via INTRAVENOUS
  Administered 2017-01-06 (×5): 80 ug via INTRAVENOUS

## 2017-01-06 MED ORDER — PROMETHAZINE HCL 25 MG/ML IJ SOLN: 6.2500 mg | INTRAMUSCULAR | Status: DC | PRN

## 2017-01-06 MED ORDER — MIDAZOLAM HCL 2 MG/2ML IJ SOLN: 0.5000 mg | Freq: Once | INTRAMUSCULAR | Status: DC | PRN

## 2017-01-06 MED ORDER — OXYTOCIN 40 UNITS IN LACTATED RINGERS INFUSION - SIMPLE MED: 2.5000 [IU]/h | INTRAVENOUS | Status: AC

## 2017-01-06 MED ORDER — NALOXONE HCL 0.4 MG/ML IJ SOLN: 0.4000 mg | INTRAMUSCULAR | Status: DC | PRN

## 2017-01-06 MED ORDER — DEXAMETHASONE SODIUM PHOSPHATE 4 MG/ML IJ SOLN
INTRAMUSCULAR | Status: DC | PRN
  Administered 2017-01-06: 4 mg via INTRAVENOUS

## 2017-01-06 MED ORDER — SCOPOLAMINE 1 MG/3DAYS TD PT72: 1.0000 | MEDICATED_PATCH | Freq: Once | TRANSDERMAL | Status: DC

## 2017-01-06 MED ORDER — DIPHENHYDRAMINE HCL 50 MG/ML IJ SOLN
12.5000 mg | INTRAMUSCULAR | Status: DC | PRN
Start: 2017-01-06 — End: 2017-01-10

## 2017-01-06 MED ORDER — DIBUCAINE 1 % RE OINT: 1.0000 "application " | TOPICAL_OINTMENT | RECTAL | Status: DC | PRN

## 2017-01-06 MED ORDER — CEFAZOLIN SODIUM-DEXTROSE 2-4 GM/100ML-% IV SOLN
2.0000 g | Freq: Once | INTRAVENOUS | Status: AC
  Administered 2017-01-06: 2 g via INTRAVENOUS
  Filled 2017-01-06: qty 100

## 2017-01-06 MED ORDER — OXYCODONE-ACETAMINOPHEN 5-325 MG PO TABS
1.0000 | ORAL_TABLET | ORAL | Status: DC | PRN
  Administered 2017-01-07 (×2): 1 via ORAL
  Filled 2017-01-06 (×2): qty 1

## 2017-01-06 MED ORDER — ONDANSETRON HCL 4 MG/2ML IJ SOLN
INTRAMUSCULAR | Status: AC
  Filled 2017-01-06: qty 2

## 2017-01-06 MED ORDER — METOCLOPRAMIDE HCL 5 MG/ML IJ SOLN
INTRAMUSCULAR | Status: AC
  Filled 2017-01-06: qty 2

## 2017-01-06 MED ORDER — PRENATAL MULTIVITAMIN CH
1.0000 | ORAL_TABLET | Freq: Every day | ORAL | Status: DC
  Administered 2017-01-07 – 2017-01-09 (×3): 1 via ORAL
  Filled 2017-01-06 (×3): qty 1

## 2017-01-06 MED ORDER — FENTANYL CITRATE (PF) 100 MCG/2ML IJ SOLN
INTRAMUSCULAR | Status: AC
  Filled 2017-01-06: qty 2

## 2017-01-06 MED ORDER — MEPERIDINE HCL 25 MG/ML IJ SOLN: 6.2500 mg | INTRAMUSCULAR | Status: DC | PRN

## 2017-01-06 MED ORDER — ONDANSETRON HCL 4 MG/2ML IJ SOLN: 4.0000 mg | Freq: Three times a day (TID) | INTRAMUSCULAR | Status: DC | PRN

## 2017-01-06 MED ORDER — OXYTOCIN 40 UNITS IN LACTATED RINGERS INFUSION - SIMPLE MED
INTRAVENOUS | Status: DC | PRN
  Administered 2017-01-06: 40 [IU] via INTRAVENOUS

## 2017-01-06 MED ORDER — NALBUPHINE HCL 10 MG/ML IJ SOLN
5.0000 mg | INTRAMUSCULAR | Status: DC | PRN
  Administered 2017-01-06: 5 mg via INTRAVENOUS
  Filled 2017-01-06: qty 1

## 2017-01-06 MED ORDER — WITCH HAZEL-GLYCERIN EX PADS: 1.0000 "application " | MEDICATED_PAD | CUTANEOUS | Status: DC | PRN

## 2017-01-06 MED ORDER — BUPIVACAINE IN DEXTROSE 0.75-8.25 % IT SOLN
INTRATHECAL | Status: DC | PRN
  Administered 2017-01-06: 1.4 mL via INTRATHECAL

## 2017-01-06 MED ORDER — LACTATED RINGERS IV SOLN
INTRAVENOUS | Status: DC
  Administered 2017-01-07 (×2): via INTRAVENOUS

## 2017-01-06 MED ORDER — ONDANSETRON HCL 4 MG/2ML IJ SOLN
INTRAMUSCULAR | Status: DC | PRN
  Administered 2017-01-06: 4 mg via INTRAVENOUS

## 2017-01-06 MED ORDER — DIPHENHYDRAMINE HCL 50 MG/ML IJ SOLN
INTRAMUSCULAR | Status: DC | PRN
  Administered 2017-01-06: 25 mg via INTRAVENOUS

## 2017-01-06 MED ORDER — OXYCODONE-ACETAMINOPHEN 5-325 MG PO TABS
2.0000 | ORAL_TABLET | ORAL | Status: DC | PRN
  Administered 2017-01-07: 2 via ORAL
  Filled 2017-01-06: qty 2

## 2017-01-06 MED ORDER — SIMETHICONE 80 MG PO CHEW
80.0000 mg | CHEWABLE_TABLET | ORAL | Status: DC
  Administered 2017-01-07 – 2017-01-10 (×4): 80 mg via ORAL
  Filled 2017-01-06 (×4): qty 1

## 2017-01-06 MED ORDER — SODIUM CHLORIDE 0.9% FLUSH: 3.0000 mL | INTRAVENOUS | Status: DC | PRN

## 2017-01-06 MED ORDER — SENNOSIDES-DOCUSATE SODIUM 8.6-50 MG PO TABS
2.0000 | ORAL_TABLET | ORAL | Status: DC
  Administered 2017-01-07 – 2017-01-10 (×3): 2 via ORAL
  Filled 2017-01-06 (×4): qty 2

## 2017-01-06 MED ORDER — CEFAZOLIN SODIUM-DEXTROSE 2-4 GM/100ML-% IV SOLN
INTRAVENOUS | Status: AC
  Filled 2017-01-06: qty 100

## 2017-01-06 MED ORDER — IBUPROFEN 600 MG PO TABS
600.0000 mg | ORAL_TABLET | Freq: Four times a day (QID) | ORAL | Status: DC
  Administered 2017-01-07 – 2017-01-10 (×12): 600 mg via ORAL
  Filled 2017-01-06 (×12): qty 1

## 2017-01-06 SURGICAL SUPPLY — 38 items
BENZOIN TINCTURE PRP APPL 2/3 (GAUZE/BANDAGES/DRESSINGS) ×3 IMPLANT
CHLORAPREP W/TINT 26ML (MISCELLANEOUS) ×3 IMPLANT
CLAMP CORD UMBIL (MISCELLANEOUS) IMPLANT
CLOSURE WOUND 1/2 X4 (GAUZE/BANDAGES/DRESSINGS) ×1
CLOTH BEACON ORANGE TIMEOUT ST (SAFETY) ×3 IMPLANT
DRSG OPSITE POSTOP 4X10 (GAUZE/BANDAGES/DRESSINGS) ×3 IMPLANT
ELECT REM PT RETURN 9FT ADLT (ELECTROSURGICAL) ×3
ELECTRODE REM PT RTRN 9FT ADLT (ELECTROSURGICAL) ×1 IMPLANT
EXTRACTOR VACUUM BELL STYLE (SUCTIONS) IMPLANT
EXTRACTOR VACUUM KIWI (MISCELLANEOUS) IMPLANT
GLOVE BIO SURGEON STRL SZ 6.5 (GLOVE) ×2 IMPLANT
GLOVE BIO SURGEONS STRL SZ 6.5 (GLOVE) ×1
GLOVE BIOGEL PI IND STRL 7.0 (GLOVE) ×2 IMPLANT
GLOVE BIOGEL PI INDICATOR 7.0 (GLOVE) ×4
GOWN STRL REUS W/TWL LRG LVL3 (GOWN DISPOSABLE) ×9 IMPLANT
KIT ABG SYR 3ML LUER SLIP (SYRINGE) IMPLANT
NEEDLE HYPO 22GX1.5 SAFETY (NEEDLE) IMPLANT
NEEDLE HYPO 25X5/8 SAFETYGLIDE (NEEDLE) IMPLANT
NS IRRIG 1000ML POUR BTL (IV SOLUTION) ×3 IMPLANT
PACK C SECTION WH (CUSTOM PROCEDURE TRAY) ×3 IMPLANT
PAD ABD 7.5X8 STRL (GAUZE/BANDAGES/DRESSINGS) ×3 IMPLANT
PAD OB MATERNITY 4.3X12.25 (PERSONAL CARE ITEMS) ×3 IMPLANT
PENCIL SMOKE EVAC W/HOLSTER (ELECTROSURGICAL) ×3 IMPLANT
RTRCTR C-SECT PINK 25CM LRG (MISCELLANEOUS) ×3 IMPLANT
SPONGE GAUZE 4X4 12PLY (GAUZE/BANDAGES/DRESSINGS) ×6 IMPLANT
STRIP CLOSURE SKIN 1/2X4 (GAUZE/BANDAGES/DRESSINGS) ×2 IMPLANT
SUT CHROMIC 2 0 CT 1 (SUTURE) ×6 IMPLANT
SUT MNCRL 0 VIOLET CTX 36 (SUTURE) ×2 IMPLANT
SUT MONOCRYL 0 CTX 36 (SUTURE) ×4
SUT PDS AB 0 CTX 36 PDP370T (SUTURE) IMPLANT
SUT PLAIN 2 0 (SUTURE)
SUT PLAIN ABS 2-0 CT1 27XMFL (SUTURE) IMPLANT
SUT VIC AB 0 CTX 36 (SUTURE) ×4
SUT VIC AB 0 CTX36XBRD ANBCTRL (SUTURE) ×2 IMPLANT
SUT VIC AB 4-0 KS 27 (SUTURE) ×3 IMPLANT
SYR CONTROL 10ML LL (SYRINGE) IMPLANT
TOWEL OR 17X24 6PK STRL BLUE (TOWEL DISPOSABLE) ×3 IMPLANT
TRAY FOLEY CATH SILVER 14FR (SET/KITS/TRAYS/PACK) ×3 IMPLANT

## 2017-01-06 NOTE — Anesthesia Postprocedure Evaluation (Signed)
Anesthesia Post Note  Patient: Rose Moyer  Procedure(s) Performed: Procedure(s) (LRB): CESAREAN SECTION (N/A)  Patient location during evaluation: PACU Anesthesia Type: Spinal Level of consciousness: oriented, patient cooperative and awake and alert Pain management: pain level controlled Vital Signs Assessment: post-procedure vital signs reviewed and stable Respiratory status: spontaneous breathing, nonlabored ventilation and respiratory function stable Cardiovascular status: blood pressure returned to baseline and stable Postop Assessment: spinal receding, patient able to bend at knees and no signs of nausea or vomiting Anesthetic complications: no        Last Vitals:  Vitals:   01/06/17 1800 01/06/17 1801  BP: (!) 175/157 122/76  Pulse: 90 88  Resp:  16  Temp:      Last Pain:  Vitals:   01/06/17 1800  TempSrc:   PainSc: 6    Pain Goal:                 [name],[name]

## 2017-01-06 NOTE — Op Note (Signed)
Cesarean Section Procedure Note  Indications: 39 week SIUP breech presentation  Pre-operative Diagnosis: 39 week 0 day pregnancy, complete breech presentation  Post-operative Diagnosis: same  Procedure:  Primary cesarean delivery                          Surgeon: Wynonia HazardPINN, Sharah Finnell STACIA, MD  Assistants: none  Anesthesia: Spinal anesthesia  ASA Class: 2  Procedure Details   The patient was counseled about the risks, benefits, complications of the cesarean section. The patient concurred with the proposed plan, giving informed consent.  The site of surgery properly noted/marked. The patient was taken to Operating Room # 1, identified as Lyman Spellereandra Bolender and the procedure verified as C-Section Delivery. A Time Out was held and the above information confirmed.  After spinal was found to adequate , the patient was placed in the dorsal supine position with a leftward tilt, draped and prepped in the usual sterile manner. A Pfannenstiel incision was made and carried down through the subcutaneous tissue to the fascia.  The fascia was incised in the midline and the fascial incision was extended laterally with Mayo scissors. The superior aspect of the fascial incision was grasped with Coker clamps x2, tented up and the rectus muscles dissected off sharply with the bovie.  The rectus was then dissected off with blunt dissection and Mayo scissors inferiorly. The rectus muscles were separated in the midline. The abdominal peritoneum was identified, tented up, entered sharply using Metzenbaum scissors, and the incision was extended superiorly and inferiorly with good visualization of the bladder. The Alexis retractor was then deployed. The vesicouterine peritoneum was identified, tented up, entered sharply with Metzenbaum scissors, and the bladder flap was created digitally. Scalpel was then used to make a low transverse incision on the uterus which was extended laterally with  blunt dissection. The fetal breech was  identified, each leg was delivered  The body was delivered until the shoulders.  Each arm was delivered via Pinard maneuver. The head was delivered via Domenic SchwabMariceau, [place], [place] maneuver. The A live female infant was bulb suctioned on the operative field cried vigorously, cord was clamped and cut and the infant was passed to the waiting neonatologist. Apgars 8/9. Placenta was then delivered manually, intact and appear normal, the uterus was cleared of all clot and debris.  There was uterine atony and hemorrhage noted from the incision.  Ring forceps were placed around the incision to stop the bleeding and Anesthesia administered another dose of pitocin.  Tone improved and the uterine incision was repaired with #0 Monocryl in running locked fashion. A second imbricating suture was performed using the same suture. The incision was hemostatic. Ovaries and tubes were inspected and normal. The uterus was inspected again and the incision was noted to be hemostatic The Alexis retractor was removed. The abdominal cavity was cleared of all clot and debris. The abdominal peritoneum was reapproximated with 2-0 chromic  in a running fashion, the rectus muscles was also reapproximated with the same #2 chromic.  The fascia was closed with 0 Vicryl in a running fashion. The subcuticular layer was irrigated and all bleeders cauterized.  30 mLof 0.25% Marcaine was injected into the subcutaneous layer.  The Scarpas fascia was re-approximated with interrupted sutures of 2-0 plain.  The skin was closed with 4-0 vicryl in a subcuticular fashion using a Mellody DanceKeith needle. The incision was dressed with benzoine, steri strips and pressure dressing. All sponge lap and needle counts were correct  x3.   Patient tolerated the procedure well and recovered in stable condition following the procedure.  Instrument, sponge, and needle counts were correct prior the abdominal closure and at the conclusion of the case.   Findings: Live female infant,  Apgars 8/9, clear amniotic fluid, normal appearing placenta, normal uterus, bilateral tubes and ovaries  Estimated Blood Loss: 1650 mL  IVF:  4500 mL LR         Drains: Foley catheter  Urine output: 100 mL clear         Specimens: Placenta to L&D         Implants: none         Complications:  Post partum Hemorrhage, Starting Hb 9.8; will get Hb in PACU and again in 4 hrs. Patient counseled about possibility of requiring a blood transfusion         Disposition: PACU - hemodynamically stable.   Tionna Gigante STACIA

## 2017-01-06 NOTE — Anesthesia Procedure Notes (Addendum)
Spinal  Patient location during procedure: OR Start time: 01/06/2017 3:33 PM End time: 01/06/2017 3:36 PM Staffing Anesthesiologist: Jairo BenJACKSON, CARSWELL Resident/CRNA: Iziah Cates, Jannet AskewHARLESETTA M Performed: other anesthesia staff  Spinal Block Patient position: sitting Prep: ChloraPrep Patient monitoring: heart rate, cardiac monitor, continuous pulse ox and blood pressure Approach: midline Location: L3-4 Injection technique: single-shot Needle Needle type: Pencil-Tip  Needle gauge: 22 G Needle length: 10 cm Assessment Sensory level: T4

## 2017-01-06 NOTE — Lactation Note (Signed)
This note was copied from a baby's chart. Lactation Consultation Note  Patient Name: Rose Moyer [name]: 01/06/2017 Reason for consult: Initial assessment;Other (Comment) (1600 cc EBL)   Initial consult with mom in PACU. Mom and infant were separated due to infant respiratory status. Mom with 1600 ccc EBL.   Mom initially reported she wants to breast and formula feed. Assisted mom in latching infant to right breast in the cross cradle hold. Infant latched in the laid back cross cradle hold using the teacup hold. Infant latched easily but did not sustain latch easily. Mom with large firm breast with compressible areola and short shaft everted nipples. After about 3 minutes mom said I dont think I can breastfeed, I think I would rather pump. Discussed with mom to let her nurses know what her plans are and a pump can be set up for her.   Discussed with mom that BF Babies feed 8-12 x in 24 hours at first feeding cues. If mom plans to breast and formula feed then BF first and then offer formula. If mom wants a pump she is to let her nurses know. BF Resources Handout and LC Brochure given, mom informed of IP/OP Services, BF Support Groups and LC phone #. Enc mom to call out for feeding assistance as needed. Report to Gardiner CoinsMichelle Kahn, RN.    Maternal Data Formula Feeding for Exclusion: No Has patient been taught Hand Expression?: Yes Does the patient have breastfeeding experience prior to this delivery?: No  Feeding Feeding Type: Breast Fed Length of feed: 3 min  LATCH Score/Interventions Latch: Repeated attempts needed to sustain latch, nipple held in mouth throughout feeding, stimulation needed to elicit sucking reflex. Intervention(s): Adjust position;Assist with latch;Breast massage;Breast compression  Audible Swallowing: None  Type of Nipple: Everted at rest and after stimulation  Comfort (Breast/Nipple): Soft / non-tender     Hold (Positioning): Assistance needed to  correctly position infant at breast and maintain latch. Intervention(s): Breastfeeding basics reviewed;Support Pillows;Position options;Skin to skin  LATCH Score: 6  Lactation Tools Discussed/Used WIC Program: Yes   Consult Status Consult Status: Follow-up Moyer: 01/07/17 Follow-up type: In-patient    Rose Moyer 01/06/2017, 6:29 PM

## 2017-01-06 NOTE — H&P (Signed)
Rose Moyer is a 21 y.o. female G1P0 @ 4739 weeks presenting for scheduled primary c-section for frank breech presentation.  She denies VB, no LOF, normal FM OB History    Gravida Para Term Preterm AB Living   1             SAB TAB Ectopic Multiple Live Births                 History reviewed. No pertinent past medical history. History reviewed. No pertinent surgical history. Family History: family history is not on file. Social History:  reports that she quit smoking about 10 months ago. She has never used smokeless tobacco. She reports that she drinks about 0.6 oz of alcohol per week . She reports that she does not use drugs.     Maternal Diabetes: No Genetic Screening: Normal Maternal Ultrasounds/Referrals: Normal Fetal Ultrasounds or other Referrals:  Other: Anatomy scan normal Maternal Substance Abuse:  No Significant Maternal Medications:  None Significant Maternal Lab Results:  Lab values include: Group B Strep positive Other Comments:  None  Review of Systems  Eyes: Negative for blurred vision and double vision.  Gastrointestinal: Negative for heartburn and nausea.  All other systems reviewed and are negative.  Maternal Medical History:  Reason for admission: Nausea. Schedule c-section  Contractions: Frequency: rare.   Perceived severity is mild.    Fetal activity: Perceived fetal activity is normal.   Last perceived fetal movement was within the past 24 hours.    Prenatal complications: no prenatal complications Prenatal Complications - Diabetes: none.      Blood pressure 135/89, pulse (!) 105, temperature 98.6 F (37 C), temperature source Oral, resp. rate 18, height 5\' 5"  (1.651 m), weight 104.3 kg (230 lb). Maternal Exam:  Uterine Assessment: Contraction strength is mild.  Contraction frequency is rare.   Abdomen: Patient reports no abdominal tenderness. Fundal height is 39 cm.   Estimated fetal weight is 3400 grams.   Fetal presentation:  vertex  Introitus: Normal vulva. Normal vagina.  Ferning test: not done.  Nitrazine test: not done. Amniotic fluid character: not assessed.  Cervix: not evaluated.   Fetal Exam Fetal Monitor Review: Baseline rate: 140.  Variability: moderate (6-25 bpm).   Pattern: accelerations present and no decelerations.    Fetal State Assessment: Category I - tracings are normal.     Physical Exam  Nursing note and vitals reviewed.   Prenatal labs: ABO, Rh: --/--/B POS (03/15 1348) Antibody: PENDING (03/15 1348) Rubella: Immune (06/16 0000) RPR: Nonreactive (06/16 0000)  HBsAg: Negative (06/16 0000)  HIV: Non-reactive (06/16 0000)  GBS: Positive (02/16 0000)   Assessment/Plan: 21 yo G1P0 at 39 weeks breech presentation On call to OR for primary c-section Ancef 2GM IV  Bicitra Risks of surgery explained to patient she voiced understanding and agrees to proceed. Consent signed, witnessed And placed into chart.    Essie HartINN, Margaurite Salido STACIA 01/06/2017, 2:42 PM

## 2017-01-06 NOTE — Progress Notes (Signed)
FHR dopplered from 1325-1328. Range from 131-135.  Engineer, maintenance (IT)Iyanah Demont RN

## 2017-01-06 NOTE — Anesthesia Preprocedure Evaluation (Signed)
Anesthesia Evaluation  Patient identified by MRN, date of birth, ID band Patient awake    Reviewed: Allergy & Precautions, NPO status , Patient's Chart, lab work & pertinent test results  History of Anesthesia Complications Negative for: history of anesthetic complications  Airway Mallampati: IV  TM Distance: >3 FB Neck ROM: Full    Dental  (+) Dental Advisory Given   Pulmonary former smoker,    breath sounds clear to auscultation       Cardiovascular negative cardio ROS   Rhythm:Regular Rate:Normal     Neuro/Psych negative neurological ROS     GI/Hepatic negative GI ROS, Neg liver ROS, neg GERD  ,  Endo/Other  Morbid obesity  Renal/GU negative Renal ROS     Musculoskeletal   Abdominal (+) + obese,   Peds  Hematology Hb 9.8, plt 449k   Anesthesia Other Findings   Reproductive/Obstetrics (+) Pregnancy                             Anesthesia Physical Anesthesia Plan  ASA: II  Anesthesia Plan: Spinal   Post-op Pain Management:    Induction:   Airway Management Planned: Natural Airway  Additional Equipment:   Intra-op Plan:   Post-operative Plan:   Informed Consent: I have reviewed the patients History and Physical, chart, labs and discussed the procedure including the risks, benefits and alternatives for the proposed anesthesia with the patient or authorized representative who has indicated his/her understanding and acceptance.   Dental advisory given  Plan Discussed with: CRNA and Surgeon  Anesthesia Plan Comments: (Plan routine monitors, SAB)        Anesthesia Quick Evaluation

## 2017-01-06 NOTE — Transfer of Care (Signed)
Immediate Anesthesia Transfer of Care Note  Patient: Rose Moyer  Procedure(s) Performed: Procedure(s): CESAREAN SECTION (N/A)  Patient Location: PACU  Anesthesia Type:Spinal  Level of Consciousness: awake, alert  and oriented  Airway & Oxygen Therapy: Patient Spontanous Breathing and Patient connected to nasal cannula oxygen  Post-op Assessment: Report given to RN and Post -op Vital signs reviewed and stable  Post vital signs: Reviewed and stable  Last Vitals:  Vitals:   01/06/17 1319  BP: 135/89  Pulse: (!) 105  Resp: 18  Temp: 37 C    Last Pain:  Vitals:   01/06/17 1319  TempSrc: Oral         Complications: No apparent anesthesia complications

## 2017-01-07 ENCOUNTER — Encounter (HOSPITAL_COMMUNITY): Payer: Self-pay | Admitting: Obstetrics & Gynecology

## 2017-01-07 LAB — CBC WITH DIFFERENTIAL/PLATELET
BASOS ABS: 0 10*3/uL (ref 0.0–0.1)
BASOS PCT: 0 %
Eosinophils Absolute: 0 10*3/uL (ref 0.0–0.7)
Eosinophils Relative: 0 %
HEMATOCRIT: 22 % — AB (ref 36.0–46.0)
HEMOGLOBIN: 7.2 g/dL — AB (ref 12.0–15.0)
LYMPHS PCT: 21 %
Lymphs Abs: 2.8 10*3/uL (ref 0.7–4.0)
MCH: 24.7 pg — ABNORMAL LOW (ref 26.0–34.0)
MCHC: 32.7 g/dL (ref 30.0–36.0)
MCV: 75.6 fL — AB (ref 78.0–100.0)
MONO ABS: 0.8 10*3/uL (ref 0.1–1.0)
Monocytes Relative: 6 %
NEUTROS ABS: 9.4 10*3/uL — AB (ref 1.7–7.7)
NEUTROS PCT: 73 %
Platelets: 336 10*3/uL (ref 150–400)
RBC: 2.91 MIL/uL — ABNORMAL LOW (ref 3.87–5.11)
RDW: 15.1 % (ref 11.5–15.5)
WBC: 13 10*3/uL — ABNORMAL HIGH (ref 4.0–10.5)

## 2017-01-07 LAB — RPR: RPR: NONREACTIVE

## 2017-01-07 MED ORDER — MISOPROSTOL 200 MCG PO TABS
ORAL_TABLET | ORAL | Status: AC
  Filled 2017-01-07: qty 5

## 2017-01-07 MED ORDER — FERROUS SULFATE 325 (65 FE) MG PO TABS
325.0000 mg | ORAL_TABLET | Freq: Three times a day (TID) | ORAL | Status: DC
  Administered 2017-01-07 – 2017-01-10 (×8): 325 mg via ORAL
  Filled 2017-01-07 (×8): qty 1

## 2017-01-07 MED ORDER — MISOPROSTOL 200 MCG PO TABS
1000.0000 ug | ORAL_TABLET | Freq: Once | ORAL | Status: AC
  Administered 2017-01-07: 1000 ug via RECTAL

## 2017-01-07 NOTE — Anesthesia Postprocedure Evaluation (Addendum)
Anesthesia Post Note  Patient: Rose Moyer  Procedure(s) Performed: Procedure(s) (LRB): CESAREAN SECTION (N/A)  Patient location during evaluation: Mother Baby Anesthesia Type: Spinal Level of consciousness: awake Pain management: pain level controlled Vital Signs Assessment: post-procedure vital signs reviewed and stable Respiratory status: spontaneous breathing Cardiovascular status: stable Postop Assessment: no headache, no backache, spinal receding and patient able to bend at knees Anesthetic complications: no        Last Vitals:  Vitals:   01/07/17 0300 01/07/17 0630  BP:    Pulse:    Resp:  20  Temp: 37.4 C 37.3 C    Last Pain:  Vitals:   01/07/17 0630  TempSrc: Oral  PainSc: 4    Pain Goal:                 Edison PaceWILKERSON,VALERIE

## 2017-01-07 NOTE — Lactation Note (Signed)
This note was copied from a baby's chart. Lactation Consultation Note  Patient Name: Rose Moyer [id number]'XToday's Date: 01/07/2017 Reason for consult: Follow-up assessment Baby at 26 hr of life. Mom no longer desires to latch baby. She would like to pump and feed. She reports pumping x2 today. She stated she is going to wait until her milk comes in to start pumping. Encouraged her to pump q3hr to help the milk transition happen more quickly. She was pleasant with lactation but did not seem like she was interested in providing breast milk. She is aware of lactation services and support group. She will call as needed.   Maternal Data    Feeding Feeding Type: Formula Nipple Type: Slow - flow  LATCH Score/Interventions                      Lactation Tools Discussed/Used     Consult Status Consult Status: Complete    Rose Moyer 01/07/2017, 6:13 PM

## 2017-01-07 NOTE — Plan of Care (Signed)
Problem: Life Cycle: Goal: Risk for postpartum hemorrhage will decrease Outcome: Progressing Pt had high estimated blood loss at delivery and increased bleeding on the unit.  Contacted on-call physician Dr Harrington Challenger at [phone] to inform of pt's bleeding and clots. Dr Harrington Challenger said to hold Motrin until at least the 0600 CBC to ensure pt met parameters for giving med. I informed Dr Harrington Challenger that pt already had methergine ordered PRN and asked if I should give it then or wait to do so. Dr Harrington Challenger advised to hold it considering pt's current vital signs- pt's blood pressure was almost at the upper limits of giving methergine and she showed no indication of hypovolemia.   I also contacted ansthesiologist Dr Jillyn Hidden  for pain control for the pt as I could not give Motrin or opiates, he ordered 1,000 mg of Tylenol at the time.  1 hr checks at 2030 and 2200 were WDL for bleeding and fundal tone.   At the next scheduled check done at 0150, pt again had boggy uterus before fundal massage and passed a large clot, EVL was 74, and pt was tachycardic with a pulse of 109. I gave methergine, and rechecked an hour later. Pt was still not firm and clots were still visible. I called Dr Harrington Challenger again, she ordered 1,000 mcg of Cytotec PR. Will continue to assess.

## 2017-01-07 NOTE — Addendum Note (Signed)
Addendum  created 01/07/17 0759 by Earmon PhoenixValerie P Yamili Lichtenwalner, CRNA   Sign clinical note

## 2017-01-07 NOTE — Progress Notes (Signed)
Late entry from am Patient is eating, ambulating, foley in place.  Pain control is good.  Appropriate lochia, no flatus.  Denies lightheadedness, dizziness or other complaints.  Vitals:   01/07/17 0630 01/07/17 1039 01/07/17 1515 01/07/17 1846  BP:  124/64 (!) 125/59 113/65  Pulse:  94 83 97  Resp: 20 20 20 19   Temp: 99.2 F (37.3 C) 99.4 F (37.4 C) 98.8 F (37.1 C) 98.8 F (37.1 C)  TempSrc: Oral Oral Oral Oral  SpO2:  98%    Weight:      Height:        Fundus firm Inc: c/d/I Ext: no CT   Lab Results  Component Value Date   WBC 13.0 (H) 01/07/2017   HGB 7.2 (L) 01/07/2017   HCT 22.0 (L) 01/07/2017   MCV 75.6 (L) 01/07/2017   PLT 336 01/07/2017    --/--/B POS, B POS (03/15 1348)  A/P Post op day #1 s/p c/s EBL 1650cc- Hb 7.2, pt is asymptomatic, iron started tid.  Will recheck in am  Routine care.   Philip AspenALLAHAN, Corby Villasenor

## 2017-01-08 LAB — CBC
HEMATOCRIT: 20.5 % — AB (ref 36.0–46.0)
HEMOGLOBIN: 6.7 g/dL — AB (ref 12.0–15.0)
MCH: 25.1 pg — ABNORMAL LOW (ref 26.0–34.0)
MCHC: 32.7 g/dL (ref 30.0–36.0)
MCV: 76.8 fL — AB (ref 78.0–100.0)
Platelets: 357 10*3/uL (ref 150–400)
RBC: 2.67 MIL/uL — ABNORMAL LOW (ref 3.87–5.11)
RDW: 15.5 % (ref 11.5–15.5)
WBC: 12 10*3/uL — AB (ref 4.0–10.5)

## 2017-01-08 NOTE — Progress Notes (Signed)
CRITICAL VALUE ALERT  Critical value received:  Hemoglobin 6.7  Date of notification:  01/08/17  Time of notification:  0650  Critical value read back:yes  Nurse who received alert:  Marquis LunchEmily Anand Tejada RN  MD notified (1st page):  Claiborne Billingsallahan  Time of first page:  [phone]  MD notified (2nd page):  Time of second page:  Responding MD:  Claiborne Billingsallahan  Time MD responded:

## 2017-01-08 NOTE — Progress Notes (Signed)
Patient is eating, ambulating, voiding.  Pain control is good.  Pt has passed some gas.  Lochia appropriate.  Denies HA, N/V, lightheadedness, dizziness, CP/SOB or palpitations.  Vitals:   01/07/17 1039 01/07/17 1515 01/07/17 1846 01/08/17 0600  BP: 124/64 (!) 125/59 113/65 131/77  Pulse: 94 83 97 72  Resp: 20 20 19 18   Temp: 99.4 F (37.4 C) 98.8 F (37.1 C) 98.8 F (37.1 C) 98.7 F (37.1 C)  TempSrc: Oral Oral Oral Oral  SpO2: 98%     Weight:      Height:        Fundus firm, abd soft otherwise and non-tender Inc: c/d/i Ext: no CT  Lab Results  Component Value Date   WBC 12.0 (H) 01/08/2017   HGB 6.7 (LL) 01/08/2017   HCT 20.5 (L) 01/08/2017   MCV 76.8 (L) 01/08/2017   PLT 357 01/08/2017    --/--/B POS, B POS (03/15 1348)  A/P Post op day #2 s/p c/s breech Doing well  Routine care.      Philip AspenALLAHAN, Rachana Malesky

## 2017-01-09 LAB — CBC
HEMATOCRIT: 20.3 % — AB (ref 36.0–46.0)
HEMOGLOBIN: 6.5 g/dL — AB (ref 12.0–15.0)
MCH: 24.7 pg — ABNORMAL LOW (ref 26.0–34.0)
MCHC: 32 g/dL (ref 30.0–36.0)
MCV: 77.2 fL — AB (ref 78.0–100.0)
Platelets: 364 10*3/uL (ref 150–400)
RBC: 2.63 MIL/uL — ABNORMAL LOW (ref 3.87–5.11)
RDW: 15.6 % — AB (ref 11.5–15.5)
WBC: 11.1 10*3/uL — ABNORMAL HIGH (ref 4.0–10.5)

## 2017-01-09 NOTE — Progress Notes (Signed)
Patient is eating, ambulating, voiding.  Pain control is good.  Appropriate lochia.  Denies CP/SOB, dizziness/lightheadedness or palpitations.  Feels fatigued.  + flatus. No other complaints.  Vitals:   01/07/17 1846 01/08/17 0600 01/08/17 1800 01/09/17 0636  BP: 113/65 131/77 (!) 126/53 116/71  Pulse: 97 72 94 94  Resp: 19 18 18 18   Temp: 98.8 F (37.1 C) 98.7 F (37.1 C) 99.1 F (37.3 C) 98.6 F (37 C)  TempSrc: Oral Oral Oral Oral  SpO2:   100%   Weight:      Height:        Fundus firm Inc: c/d/i Abd soft, no r/G Ext: no CT  Lab Results  Component Value Date   WBC 11.1 (H) 01/09/2017   HGB 6.5 (LL) 01/09/2017   HCT 20.3 (L) 01/09/2017   MCV 77.2 (L) 01/09/2017   PLT 364 01/09/2017    --/--/B POS, B POS (03/15 1348)  A/P Post op day #3 s/p c/s with EBL 1650 Anemia- asymptomatic.  Hb dropped 9.8- 8.4 - 7.2 - 6.7 - 6.5 (last value today), taking iron tid.  Vitals stable, normal BP, slight tachycardia.  Will keep overnight for add'l monitoring.  Routine care.    Rose Moyer, Rose Moyer

## 2017-01-10 LAB — CBC
HCT: 19.9 % — ABNORMAL LOW (ref 36.0–46.0)
Hemoglobin: 6.3 g/dL — CL (ref 12.0–15.0)
MCH: 25 pg — ABNORMAL LOW (ref 26.0–34.0)
MCHC: 31.7 g/dL (ref 30.0–36.0)
MCV: 79 fL (ref 78.0–100.0)
PLATELETS: 398 10*3/uL (ref 150–400)
RBC: 2.52 MIL/uL — AB (ref 3.87–5.11)
RDW: 15.8 % — AB (ref 11.5–15.5)
WBC: 9.8 10*3/uL (ref 4.0–10.5)

## 2017-01-10 LAB — TYPE AND SCREEN
ABO/RH(D): B POS
Antibody Screen: NEGATIVE
UNIT DIVISION: 0
Unit division: 0

## 2017-01-10 LAB — BPAM RBC
BLOOD PRODUCT EXPIRATION DATE: 201804052359
BLOOD PRODUCT EXPIRATION DATE: 201804062359
ISSUE DATE / TIME: 201803040836
UNIT TYPE AND RH: 5100
Unit Type and Rh: 5100

## 2017-01-10 MED ORDER — DOCUSATE SODIUM 100 MG PO CAPS: 100.0000 mg | ORAL_CAPSULE | Freq: Two times a day (BID) | ORAL | 0 refills | Status: DC

## 2017-01-10 MED ORDER — IBUPROFEN 600 MG PO TABS: 600.0000 mg | ORAL_TABLET | Freq: Four times a day (QID) | ORAL | 0 refills | Status: DC | PRN

## 2017-01-10 MED ORDER — FERROUS SULFATE 325 (65 FE) MG PO TBEC: 325.0000 mg | DELAYED_RELEASE_TABLET | Freq: Three times a day (TID) | ORAL | 3 refills | Status: DC

## 2017-01-10 MED ORDER — OXYCODONE-ACETAMINOPHEN 5-325 MG PO TABS: 1.0000 | ORAL_TABLET | ORAL | 0 refills | Status: DC | PRN

## 2017-01-10 MED FILL — IBUPROFEN 600 MG TABLET: 600 | 22 days supply | Qty: 90 | Fill #0

## 2017-01-10 MED FILL — OXYCODONE/APAP 5/325 MG TAB: 5-325 | 3 days supply | Qty: 30 | Fill #0

## 2017-01-10 NOTE — Discharge Instructions (Signed)
Cesarean Delivery °Cesarean birth, or cesarean delivery, is the surgical delivery of a baby through an incision in the abdomen and the uterus. This may be referred to as a C-section. This procedure may be scheduled ahead of time, or it may be done in an emergency situation. °Tell a health care provider about: °· Any allergies you have. °· All medicines you are taking, including vitamins, herbs, eye drops, creams, and over-the-counter medicines. °· Any problems you or family members have had with anesthetic medicines. °· Any blood disorders you have. °· Any surgeries you have had. °· Any medical conditions you have. °· Whether you or any members of your family have a history of deep vein thrombosis (DVT) or pulmonary embolism (PE). °What are the risks? °Generally, this is a safe procedure. However, problems may occur, including: °· Infection. °· Bleeding. °· Allergic reactions to medicines. °· Damage to other structures or organs. °· Blood clots. °· Injury to your baby. ° °What happens before the procedure? °· Follow instructions from your health care provider about eating or drinking restrictions. °· Follow instructions from your health care provider about bathing before your procedure to help reduce your risk of infection. °· If you know that you are going to have a cesarean delivery, do not shave your pubic area. Shaving before the procedure may increase your risk of infection. °· Ask your health care provider about: °? Changing or stopping your regular medicines. This is especially important if you are taking diabetes medicines or blood thinners. °? Your pain management plan. This is especially important if you plan to breastfeed your baby. °? How long you will be in the hospital after the procedure. °? Any concerns you may have about receiving blood products if you need them during the procedure. °? Cord blood banking, if you plan to collect your baby’s umbilical cord blood. °· You may also want to ask your  health care provider: °? Whether you will be able to hold or breastfeed your baby while you are still in the operating room. °? Whether your baby can stay with you immediately after the procedure and during your recovery. °? Whether a family member or a person of your choice can go with you into the operating room and stay with you during the procedure, immediately after the procedure, and during your recovery. °· Plan to have someone drive you home when you are discharged from the hospital. °What happens during the procedure? °· Fetal monitors will be placed on your abdomen to monitor your heart rate and your baby's heart rate. °· Depending on the reason for your cesarean delivery, you may have a physical exam or additional testing, such as an ultrasound. °· An IV tube will be inserted into one of your veins. °· You may have your blood or urine tested. °· You will be given antibiotic medicine to help prevent infection. °· You may be given a special warming gown to wear to keep your temperature stable. °· Hair may be removed from your pubic area. °· The skin of your pubic area and lower abdomen will be cleaned with a germ-killing solution (antiseptic). °· A catheter may be inserted into your bladder through your urethra. This drains your urine during the procedure. °· You may be given one or more of the following: °? A medicine to numb the area (local anesthetic). °? A medicine to make you fall asleep (general anesthetic). °? A medicine (regional anesthetic) that is injected into your back or through a small   thin tube placed in your back (spinal anesthetic or epidural anesthetic). This numbs everything below the injection site and allows you to stay awake during your procedure. If this makes you feel nauseous, tell your health care provider. Medicines will be available to help reduce any nausea you may feel. °· An incision will be made in your abdomen, and then in your uterus. °· If you are awake during your  procedure, you may feel tugging and pulling in your abdomen, but you should not feel pain. If you feel pain, tell your health care provider immediately. °· Your baby will be removed from your uterus. You may feel more pressure or pushing while this happens. °· Immediately after birth, your baby will be dried and kept warm. You may be able to hold and breastfeed your baby. The umbilical cord may be clamped and cut during this time. °· Your placenta will be removed from your uterus. °· Your incisions will be closed with stitches (sutures). Staples, skin glue, or adhesive strips may also be applied to the incision in your abdomen. °· Bandages (dressings) will be placed over the incision in your abdomen. °The procedure may vary among health care providers and hospitals. °What happens after the procedure? °· Your blood pressure, heart rate, breathing rate, and blood oxygen level will be monitored often until the medicines you were given have worn off. °· You may continue to receive fluids and medicines through an IV tube. °· You will have some pain. Medicines will be available to help control your pain. °· To help prevent blood clots: °? You may be given medicines. °? You may have to wear compression stockings or devices. °? You will be encouraged to walk around when you are able. °· Hospital staff will encourage and support bonding with your baby. Your hospital may allow you and your baby to stay in the same room (rooming in) during your hospital stay to encourage successful breastfeeding. °· You may be encouraged to cough and breathe deeply often. This helps to prevent lung problems. °· If you have a catheter draining your urine, it will be removed as soon as possible after your procedure. °This information is not intended to replace advice given to you by your health care provider. Make sure you discuss any questions you have with your health care provider. °Document Released: 10/11/2005 Document Revised: 03/18/2016  Document Reviewed: 07/22/2015 °Elsevier Interactive Patient Education © 2017 Elsevier Inc. ° °

## 2017-01-10 NOTE — Discharge Summary (Signed)
Obstetric Discharge Summary Reason for Admission: cesarean section Prenatal Procedures: ultrasound Intrapartum Procedures: cesarean: low cervical, transverse Postpartum Procedures: none Complications-Operative and Postpartum: hemorrhage Hemoglobin  Date Value Ref Range Status  01/10/2017 6.3 (LL) 12.0 - 15.0 g/dL Final    Comment:    REPEATED TO VERIFY CRITICAL VALUE NOTED.  VALUE IS CONSISTENT WITH PREVIOUSLY REPORTED AND CALLED VALUE.    HCT  Date Value Ref Range Status  01/10/2017 19.9 (L) 36.0 - 46.0 % Final    Physical Exam:  General: alert, cooperative and appears stated age 3Lochia: appropriate Uterine Fundus: firm Incision: healing well DVT Evaluation: No evidence of DVT seen on physical exam.  Discharge Diagnoses: Term Pregnancy-delivered  Discharge Information: Date: 01/10/2017 Activity: pelvic rest Diet: routine Medications: Ibuprofen, Colace, Iron and Percocet Condition: improved Instructions: refer to practice specific booklet Discharge to: home Follow-up Information    PINN, Sanjuana MaeWALDA STACIA, MD Follow up in 4 week(s).   Specialty:  Obstetrics and Gynecology Why:  For a postpartum evaluation Contact information: [address] [place] [address] [phone]           Newborn Data: Live born female  Birth Weight: 9 lb 9.1 oz (4340 g) APGAR: 8, 9  Home with mother.  Sumiye Hirth H. 01/10/2017, 9:06 AM

## 2017-01-10 NOTE — Lactation Note (Signed)
This note was copied from a baby's chart. Lactation Consultation Note  Baby 90 hours old and primarily formula feeding. Mother's breasts are filling.  Provided mother w/ manual pump. She declined DEBP rental. Reviewed engorgement care and monitoring voids/stools.   Patient Name: Girl Lyman Spellereandra Kosier ZOXWR'UToday's Date: 01/10/2017     Maternal Data    Feeding Feeding Type: Bottle Fed - Formula Nipple Type: Slow - flow  LATCH Score/Interventions                      Lactation Tools Discussed/Used     Consult Status      Hardie PulleyBerkelhammer, Ruth Boschen 01/10/2017, 10:24 AM

## 2017-01-12 ENCOUNTER — Encounter (HOSPITAL_COMMUNITY): Admission: RE | Admit: 2017-01-12 | Payer: Medicaid Other | Source: Ambulatory Visit

## 2017-02-09 MED FILL — LARIN FE 1.5-30 TABLET: 1.5-30 | 84 days supply | Qty: 84 | Fill #0

## 2017-04-28 MED FILL — LARIN FE 1.5-30 TABLET: 1.5-30 | 84 days supply | Qty: 84 | Fill #1

## 2017-05-09 NOTE — Addendum Note (Signed)
Addendum  created 05/09/17 1723 by Jairo BenJackson, Salma Walrond, MD   Sign clinical note

## 2017-05-17 DIAGNOSIS — L309 Dermatitis, unspecified: Secondary | ICD-10-CM | POA: Diagnosis not present

## 2017-05-17 DIAGNOSIS — L600 Ingrowing nail: Secondary | ICD-10-CM | POA: Diagnosis not present

## 2017-06-15 DIAGNOSIS — Z124 Encounter for screening for malignant neoplasm of cervix: Secondary | ICD-10-CM | POA: Diagnosis not present

## 2017-06-15 DIAGNOSIS — Z01419 Encounter for gynecological examination (general) (routine) without abnormal findings: Secondary | ICD-10-CM | POA: Diagnosis not present

## 2017-06-15 DIAGNOSIS — Z113 Encounter for screening for infections with a predominantly sexual mode of transmission: Secondary | ICD-10-CM | POA: Diagnosis not present

## 2017-07-18 MED FILL — LARIN FE 1.5-30 TABLET: 1.5-30 | 84 days supply | Qty: 84 | Fill #2

## 2017-09-26 DIAGNOSIS — Z23 Encounter for immunization: Secondary | ICD-10-CM | POA: Diagnosis not present

## 2017-10-13 MED FILL — LARIN FE 1.5-30 TABLET: 1.5-30 | 84 days supply | Qty: 84 | Fill #3

## 2017-11-24 DIAGNOSIS — Z23 Encounter for immunization: Secondary | ICD-10-CM | POA: Diagnosis not present

## 2017-12-21 ENCOUNTER — Ambulatory Visit (INDEPENDENT_AMBULATORY_CARE_PROVIDER_SITE_OTHER): Payer: 59 | Admitting: Family Medicine

## 2017-12-21 ENCOUNTER — Encounter: Payer: Self-pay | Admitting: Family Medicine

## 2017-12-21 VITALS — BP 124/86 | HR 77 | Temp 98.7°F | Ht 65.0 in | Wt 182.0 lb

## 2017-12-21 DIAGNOSIS — J069 Acute upper respiratory infection, unspecified: Secondary | ICD-10-CM | POA: Diagnosis not present

## 2017-12-21 DIAGNOSIS — Z1389 Encounter for screening for other disorder: Secondary | ICD-10-CM

## 2017-12-21 LAB — POCT URINALYSIS DIP (DEVICE)
BILIRUBIN URINE: NEGATIVE
Glucose, UA: NEGATIVE mg/dL
Ketones, ur: NEGATIVE mg/dL
NITRITE: NEGATIVE
Protein, ur: NEGATIVE mg/dL
Specific Gravity, Urine: 1.025 (ref 1.005–1.030)
Urobilinogen, UA: 0.2 mg/dL (ref 0.0–1.0)
pH: 6.5 (ref 5.0–8.0)

## 2017-12-21 MED ORDER — NAPROXEN 500 MG PO TABS: 500.0000 mg | ORAL_TABLET | Freq: Two times a day (BID) | ORAL | 0 refills | Status: DC

## 2017-12-21 MED ORDER — BENZONATATE 100 MG PO CAPS: 100.0000 mg | ORAL_CAPSULE | Freq: Three times a day (TID) | ORAL | 0 refills | Status: DC | PRN

## 2017-12-21 MED FILL — NAPROXEN 500 MG TABLET: 500 | 15 days supply | Qty: 30 | Fill #0

## 2017-12-21 MED FILL — BENZONATATE 100 MG CAP: 100 | 10 days supply | Qty: 60 | Fill #0

## 2017-12-21 NOTE — Patient Instructions (Signed)
You are likely experiencing early onset symptoms of upper respiratory illness. For now symptoms management is indicated. For throat pain start Naproxen 500 mg twice daily with food as needed. For cough, start benzonatate 100-200 mg up to 3 times daily.  If no improvement in symptoms, contact me for antibiotic.    Viral Illness, Adult Viruses are tiny germs that can get into a person's body and cause illness. There are many different types of viruses, and they cause many types of illness. Viral illnesses can range from mild to severe. They can affect various parts of the body. Common illnesses that are caused by a virus include colds and the flu. Viral illnesses also include serious conditions such as HIV/AIDS (human immunodeficiency virus/acquired immunodeficiency syndrome). A few viruses have been linked to certain cancers. What are the causes? Many types of viruses can cause illness. Viruses invade cells in your body, multiply, and cause the infected cells to malfunction or die. When the cell dies, it releases more of the virus. When this happens, you develop symptoms of the illness, and the virus continues to spread to other cells. If the virus takes over the function of the cell, it can cause the cell to divide and grow out of control, as is the case when a virus causes cancer. Different viruses get into the body in different ways. You can get a virus by:  Swallowing food or water that is contaminated with the virus.  Breathing in droplets that have been coughed or sneezed into the air by an infected person.  Touching a surface that has been contaminated with the virus and then touching your eyes, nose, or mouth.  Being bitten by an insect or animal that carries the virus.  Having sexual contact with a person who is infected with the virus.  Being exposed to blood or fluids that contain the virus, either through an open cut or during a transfusion.  If a virus enters your body, your  body's defense system (immune system) will try to fight the virus. You may be at higher risk for a viral illness if your immune system is weak. What are the signs or symptoms? Symptoms vary depending on the type of virus and the location of the cells that it invades. Common symptoms of the main types of viral illnesses include: Cold and flu viruses  Fever.  Headache.  Sore throat.  Muscle aches.  Nasal congestion.  Cough. Digestive system (gastrointestinal) viruses  Fever.  Abdominal pain.  Nausea.  Diarrhea. Liver viruses (hepatitis)  Loss of appetite.  Tiredness.  Yellowing of the skin (jaundice). Brain and spinal cord viruses  Fever.  Headache.  Stiff neck.  Nausea and vomiting.  Confusion or sleepiness. Skin viruses  Warts.  Itching.  Rash. Sexually transmitted viruses  Discharge.  Swelling.  Redness.  Rash. How is this treated? Viruses can be difficult to treat because they live within cells. Antibiotic medicines do not treat viruses because these drugs do not get inside cells. Treatment for a viral illness may include:  Resting and drinking plenty of fluids.  Medicines to relieve symptoms. These can include over-the-counter medicine for pain and fever, medicines for cough or congestion, and medicines to relieve diarrhea.  Antiviral medicines. These drugs are available only for certain types of viruses. They may help reduce flu symptoms if taken early. There are also many antiviral medicines for hepatitis and HIV/AIDS.  Some viral illnesses can be prevented with vaccinations. A common example is the flu shot.  Follow these instructions at home: Medicines   Take over-the-counter and prescription medicines only as told by your health care provider.  If you were prescribed an antiviral medicine, take it as told by your health care provider. Do not stop taking the medicine even if you start to feel better.  Be aware of when antibiotics are  needed and when they are not needed. Antibiotics do not treat viruses. If your health care provider thinks that you may have a bacterial infection as well as a viral infection, you may get an antibiotic. ? Do not ask for an antibiotic prescription if you have been diagnosed with a viral illness. That will not make your illness go away faster. ? Frequently taking antibiotics when they are not needed can lead to antibiotic resistance. When this develops, the medicine no longer works against the bacteria that it normally fights. General instructions  Drink enough fluids to keep your urine clear or pale yellow.  Rest as much as possible.  Return to your normal activities as told by your health care provider. Ask your health care provider what activities are safe for you.  Keep all follow-up visits as told by your health care provider. This is important. How is this prevented? Take these actions to reduce your risk of viral infection:  Eat a healthy diet and get enough rest.  Wash your hands often with soap and water. This is especially important when you are in public places. If soap and water are not available, use hand sanitizer.  Avoid close contact with friends and family who have a viral illness.  If you travel to areas where viral gastrointestinal infection is common, avoid drinking water or eating raw food.  Keep your immunizations up to date. Get a flu shot every year as told by your health care provider.  Do not share toothbrushes, nail clippers, razors, or needles with other people.  Always practice safe sex.  Contact a health care provider if:  You have symptoms of a viral illness that do not go away.  Your symptoms come back after going away.  Your symptoms get worse. Get help right away if:  You have trouble breathing.  You have a severe headache or a stiff neck.  You have severe vomiting or abdominal pain. This information is not intended to replace advice given  to you by your health care provider. Make sure you discuss any questions you have with your health care provider. Document Released: 02/20/2016 Document Revised: 03/24/2016 Document Reviewed: 02/20/2016 Elsevier Interactive Patient Education  [employer]2018 Elsevier Inc.

## 2017-12-21 NOTE — Progress Notes (Signed)
Patient ID: Rose Moyer, female    DOB: 1996/01/12, [age] y.o.   MRN: [id number]  PCP: Rose Moyer, Rose Kruckenberg S, FNP  Chief Complaint  Patient presents with  . Establish Care  . Cough  . Jaw Pain    left     Subjective:  HPI-Rose Moyer is a 22 y.o. female presents to establish care and evaluation of recent left submandibular pain and cough. Medical history significant for childbirth with C-section. Today to monitor complains of a recent cough times 3 days an acute onset of left lower submandibular pain. Submandibular pain increases with opening and closing mouth.  She also still complains of associated left-sided throat pain which is exacerbated with swallowing.  Recent sick contact includes infant child.  She has not attempted relief with any medication.  She denies fever, shortness of breath, history of asthma, chest tightness, or dizziness. Social History   Socioeconomic History  . Marital status: Single    Spouse name: Not on file  . Number of children: Not on file  . Years of education: Not on file  . Highest education level: Not on file  Social Needs  . Financial resource strain: Not on file  . Food insecurity - worry: Not on file  . Food insecurity - inability: Not on file  . Transportation needs - medical: Not on file  . Transportation needs - non-medical: Not on file  Occupational History  . Not on file  Tobacco Use  . Smoking status: Former Smoker    Last attempt to quit: 03/07/2016    Years since quitting: 1.7  . Smokeless tobacco: Never Used  Substance and Sexual Activity  . Alcohol use: Yes    Alcohol/week: 0.6 oz    Types: 1 Cans of beer per week    Comment: DRANK YEARS  AGO   . Drug use: No  . Sexual activity: No  Other Topics Concern  . Not on file  Social History Narrative  . Not on file      Review of Systems  Constitutional: Negative for activity change, appetite change, chills, diaphoresis, fatigue, fever and unexpected weight change.  HENT:  Positive for congestion and facial swelling. Negative for ear discharge and ear pain.   Eyes: Negative.   Respiratory: Positive for cough.   Cardiovascular: Negative.   Gastrointestinal: Negative.   Endocrine: Negative.   Genitourinary: Negative.   Skin: Negative.   Neurological: Positive for headaches. Negative for dizziness, facial asymmetry and weakness.       Noticed headaches occurring intermittently over the last 3 days  Hematological: Negative.   Psychiatric/Behavioral: Negative for agitation, behavioral problems, confusion, hallucinations, self-injury, sleep disturbance and suicidal ideas. The patient is not nervous/anxious.    Patient Active Problem List   Diagnosis Date Noted  . Breech presentation 01/06/2017  . Postpartum hemorrhage of cesarean section wound 01/06/2017    Family history negative for cardiovascular disease, diabetes, or lung disease.   No Known Allergies  Prior to Admission medications   Medication Sig Start Date End Date Taking? Authorizing Provider  norethindrone-ethinyl estradiol-iron (LARIN FE 1.5/30) 1.5-30 MG-MCG tablet Take 1 tablet by mouth daily.   Yes [provider]  docusate sodium (COLACE) 100 MG capsule Take 1 capsule (100 mg total) by mouth 2 (two) times daily. Patient not taking: Reported on 12/21/2017 01/10/17   Waynard Reedsoss, Kendra, MD  ferrous sulfate (FE TABS) 325 (65 FE) MG EC tablet Take 1 tablet (325 mg total) by mouth 3 (three) times daily  with meals. Patient not taking: Reported on 12/21/2017 01/10/17   Waynard Reeds, MD  ibuprofen (ADVIL,MOTRIN) 600 MG tablet Take 1 tablet (600 mg total) by mouth every 6 (six) hours as needed. Patient not taking: Reported on 12/21/2017 01/10/17   Waynard Reeds, MD  oxyCODONE-acetaminophen (ROXICET) 5-325 MG tablet Take 1-2 tablets by mouth every 4 (four) hours as needed for severe pain. Patient not taking: Reported on 12/21/2017 01/10/17   Waynard Reeds, MD    Past Medical, Surgical Family and Social  History reviewed and updated.    Objective:   Today'Moyer Vitals   12/21/17 1249  BP: 124/86  Pulse: 77  Temp: 98.7 F (37.1 C)  TempSrc: Oral  SpO2: 99%  Weight: 182 lb (82.6 kg)  Height: 5\' 5"  (1.651 m)    Wt Readings from Last 3 Encounters:  12/21/17 182 lb (82.6 kg)  01/05/17 230 lb (104.3 kg)  10/12/16 213 lb (96.6 kg)    Physical Exam  Constitutional: She is oriented to person, place, and time. She appears well-developed and well-nourished.  HENT:  Head: Normocephalic and atraumatic.  Eyes: Conjunctivae are normal. Pupils are equal, round, and reactive to light.  Neck:  Adenopathy left side   Cardiovascular: Normal rate, regular rhythm, normal heart sounds and intact distal pulses.  Pulmonary/Chest: Effort normal and breath sounds normal.  Musculoskeletal: Normal range of motion.  Lymphadenopathy:    She has cervical adenopathy.  Neurological: She is alert and oriented to person, place, and time.  Skin: Skin is warm and dry.  Psychiatric: She has a normal mood and affect. Her behavior is normal. Judgment and thought content normal.   Assessment & Plan:  1. Upper respiratory tract infection, unspecified type, most likely viral in nature. Antibiotic therapy is warranted at this time. Symptomatic management indicated only. Will consider antibiotic therapy if symptoms worsen or do not improve.   Meds ordered this encounter  Medications  . naproxen (NAPROSYN) 500 MG tablet    Sig: Take 1 tablet (500 mg total) by mouth 2 (two) times daily with a meal.    Dispense:  30 tablet    Refill:  0    Order Specific Question:   Supervising Provider    Answer:   Quentin Angst [id number]  . benzonatate (TESSALON) 100 MG capsule    Sig: Take 1-2 capsules (100-200 mg total) by mouth 3 (three) times daily as needed for cough.    Dispense:  60 capsule    Refill:  0    Order Specific Question:   Supervising Provider    Answer:   Quentin Angst [[id number]]    RTC:   PRN  Godfrey Pick. Tiburcio Pea, MSN, FNP-C [facility]  [address] [place], [place] [address] [phone]

## 2018-01-03 ENCOUNTER — Telehealth: Payer: Self-pay

## 2018-01-03 MED ORDER — AMOXICILLIN-POT CLAVULANATE 875-125 MG PO TABS: 1.0000 | ORAL_TABLET | Freq: Two times a day (BID) | ORAL | 0 refills | Status: DC

## 2018-01-03 MED ORDER — FLUCONAZOLE 150 MG PO TABS: 150.0000 mg | ORAL_TABLET | Freq: Once | ORAL | 0 refills | Status: AC

## 2018-01-03 MED ORDER — FLUCONAZOLE 150 MG PO TABS: 150.0000 mg | ORAL_TABLET | Freq: Once | ORAL | 0 refills | Status: DC

## 2018-01-03 NOTE — Telephone Encounter (Signed)
E-prescribed Augmentin 875-125 mg twice daily x 10 days for pharyngitis/tonsiliits. I have e-prescribed a diflucan 150 mg x 1 if vaginal irritation occurs.   Godfrey PickKimberly S. Tiburcio PeaHarris, MSN, FNP-C [facility]  [address] [place], [place] [address] [phone]

## 2018-01-04 MED FILL — LARIN FE 1.5-30 TABLET: 1.5-30 | 84 days supply | Qty: 84 | Fill #4

## 2018-01-04 MED FILL — FLUCONAZOLE 150 MG TABLET: 150 | 1 days supply | Qty: 1 | Fill #0

## 2018-01-04 MED FILL — AMOX TR-K CLV 875-125 MG TA: 875-125 | 10 days supply | Qty: 20 | Fill #0

## 2018-03-29 MED FILL — LARIN FE 1.5-30 TABLET: 1.5-30 | 84 days supply | Qty: 84 | Fill #0

## 2018-05-05 IMAGING — US US RENAL
1 series · 15 of 25 positions shown · non-contrast
Comparison: None.

CLINICAL DATA: Right upper abdominal and back pain x4 days.
Pregnant.

EXAM:
RENAL / URINARY TRACT ULTRASOUND COMPLETE

[Series 1: us renal · 15 of 34 slices shown]
[im 1/34]
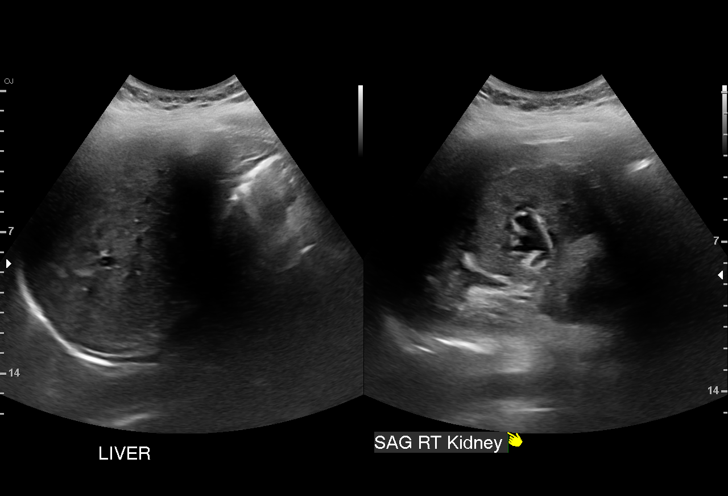
[im 3/34]
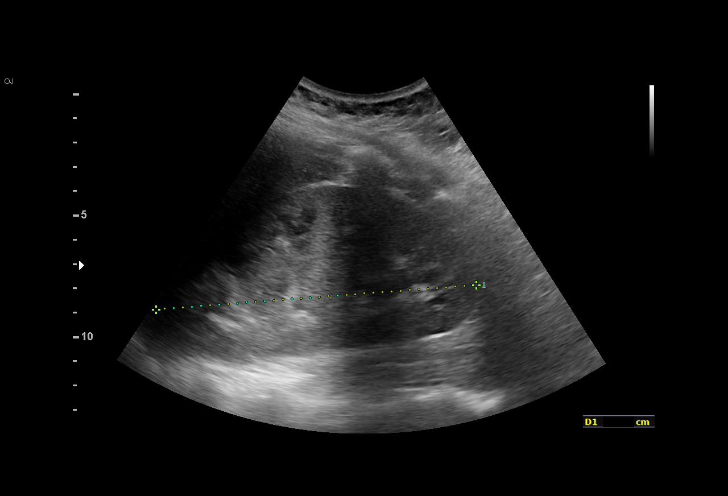
[im 6/34]
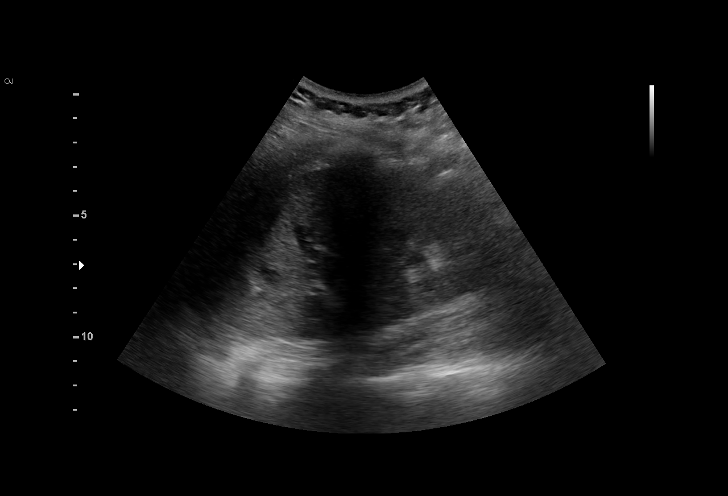
[im 7/34]
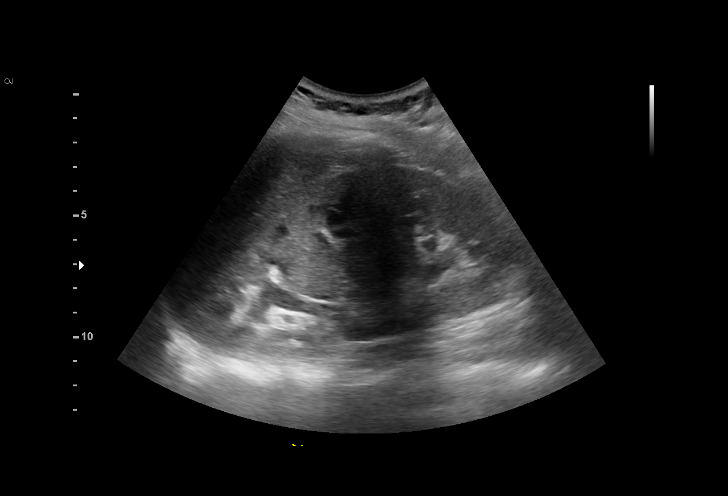
[im 10/34]
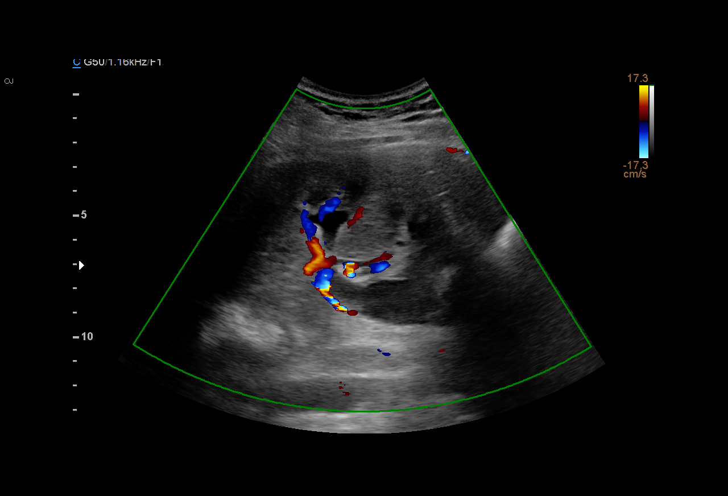
[im 13/34]
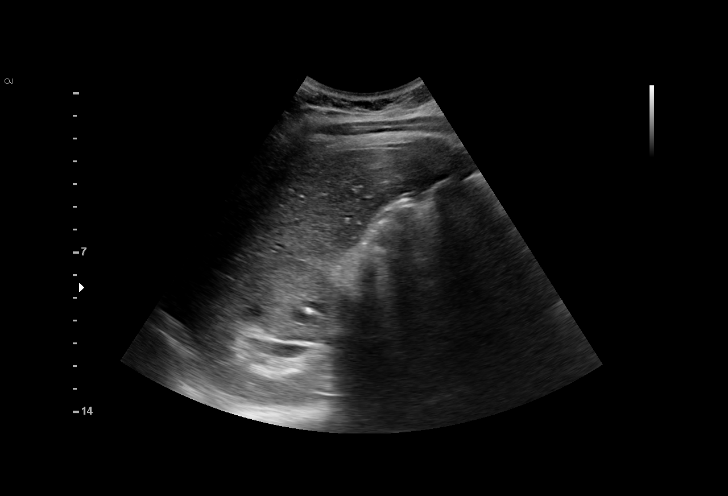
[im 14/34]
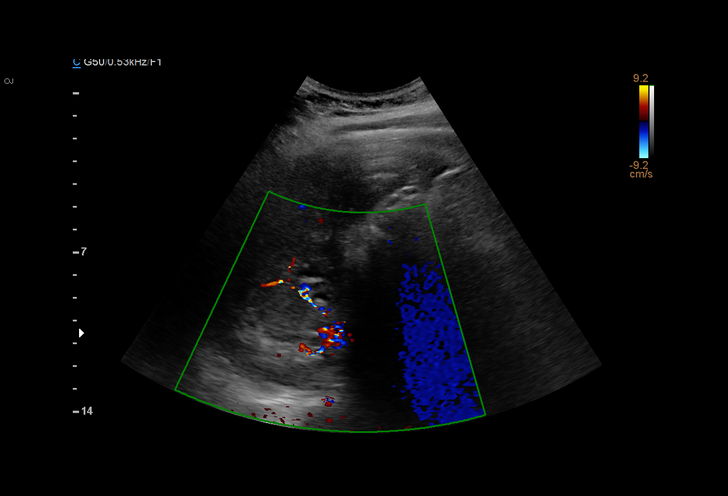
[im 17/34]
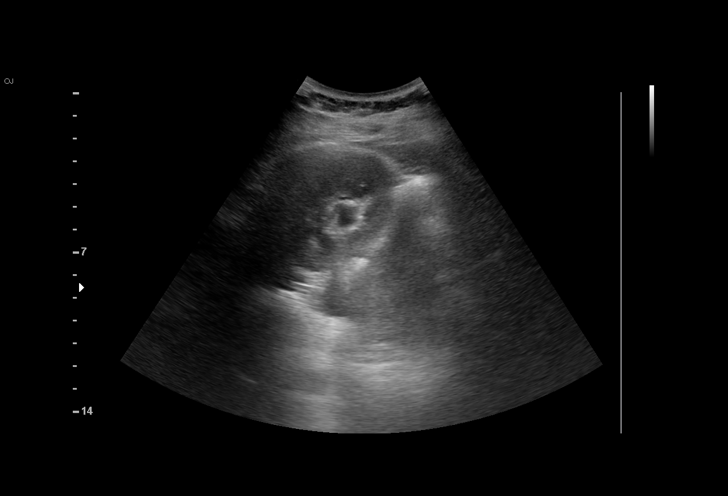
[im 20/34]
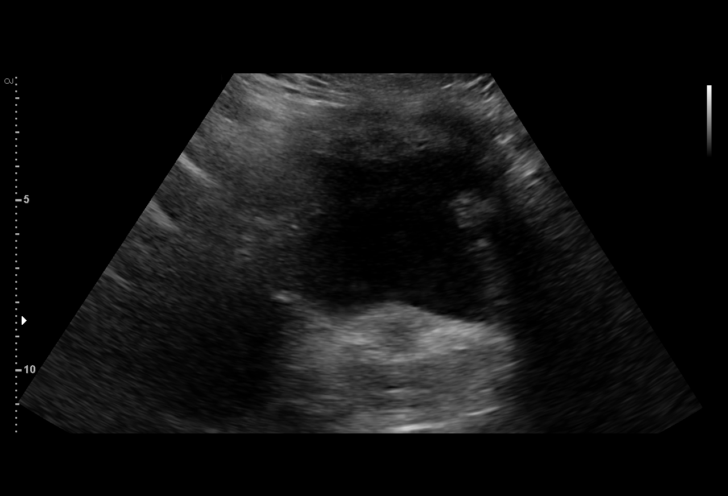
[im 21/34]
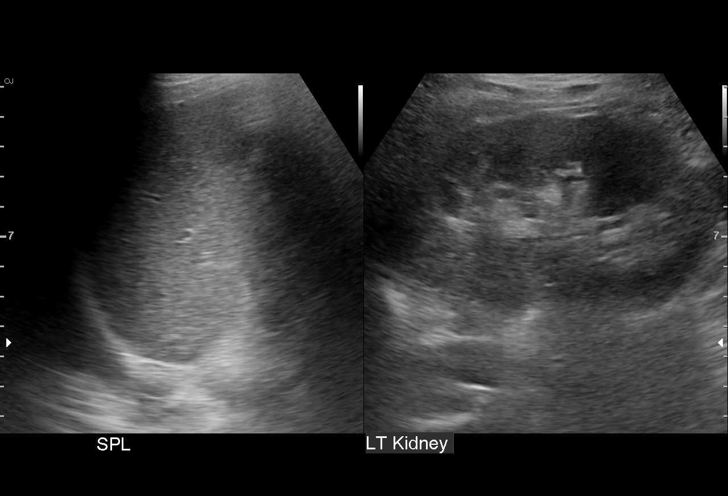
[im 24/34]
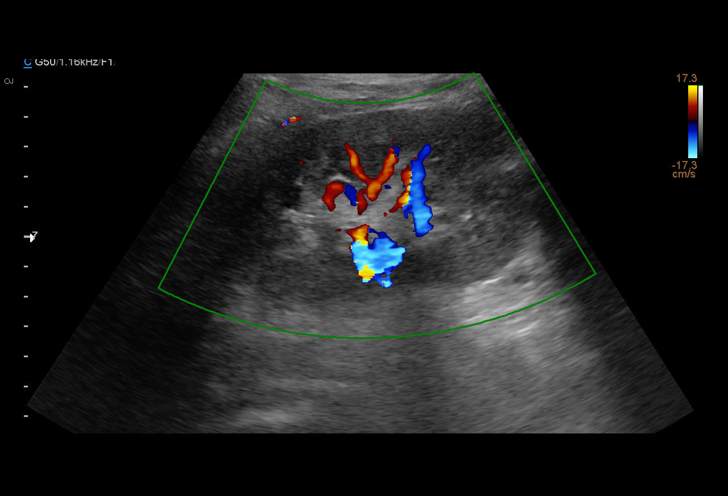
[im 27/34]
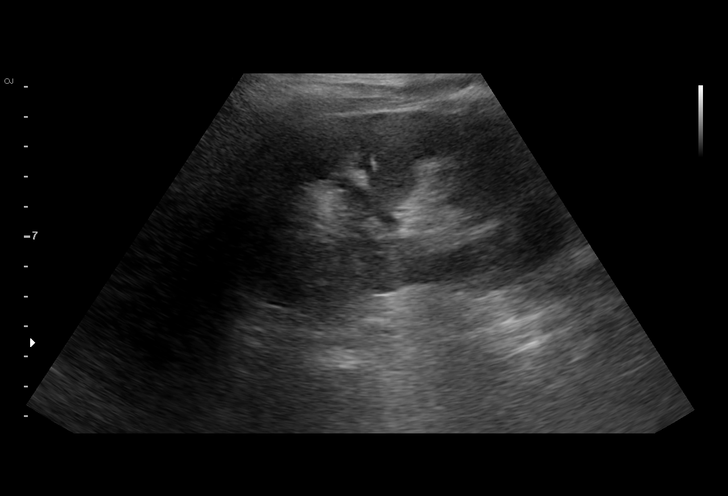
[im 28/34]
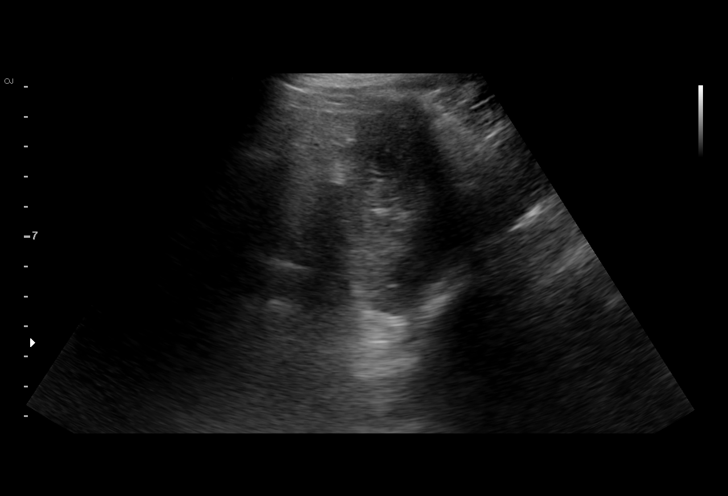
[im 31/34]
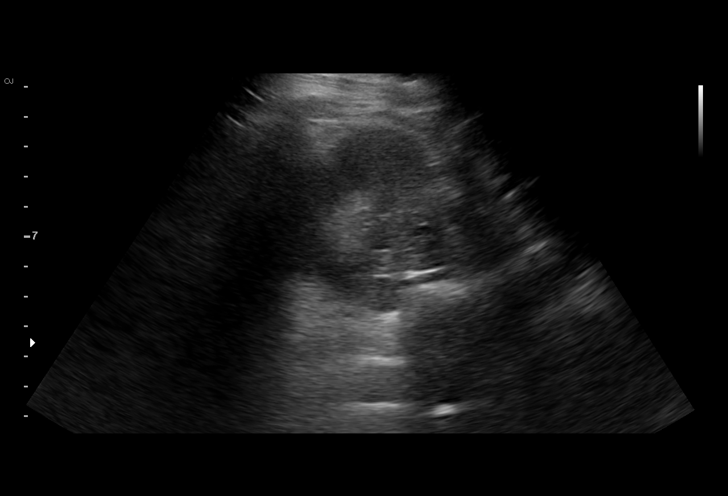
[im 34/34]
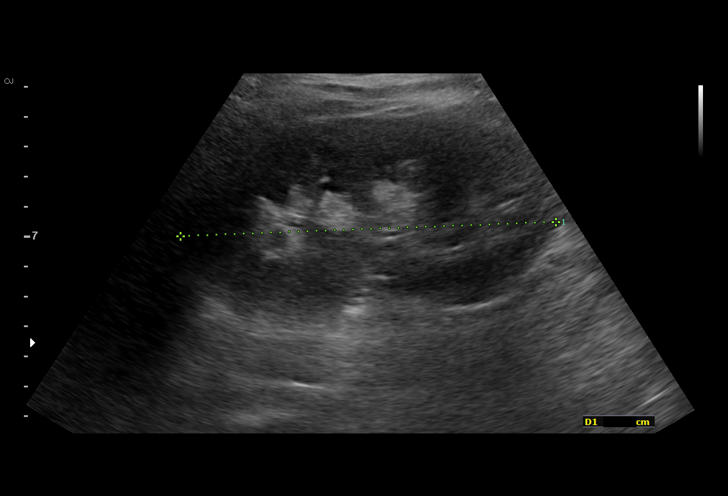

[15 of 25 positions shown; findings below may reference images not displayed]

FINDINGS: Right Kidney:

Length: 13.2 cm. Echogenicity within normal limits. Mild
hydronephrosis.

Left Kidney:

Length: 12.5 cm.  No mass.  Mild hydronephrosis.

Bladder:

Incompletely distended.
IMPRESSION: 1. Mild bilateral hydronephrosis, which can be physiologic in the
setting of Third trimester pregnancy.

## 2018-05-19 DIAGNOSIS — Z23 Encounter for immunization: Secondary | ICD-10-CM | POA: Diagnosis not present

## 2018-06-13 MED FILL — LARIN FE 1.5-30 TABLET: 1.5-30 | 84 days supply | Qty: 84 | Fill #0

## 2018-06-24 ENCOUNTER — Telehealth: Payer: 59 | Admitting: Family

## 2018-06-24 DIAGNOSIS — N390 Urinary tract infection, site not specified: Secondary | ICD-10-CM

## 2018-06-24 MED ORDER — CEPHALEXIN 500 MG PO CAPS: 500.0000 mg | ORAL_CAPSULE | Freq: Two times a day (BID) | ORAL | 0 refills | Status: DC

## 2018-06-24 NOTE — Progress Notes (Signed)

## 2018-09-09 ENCOUNTER — Encounter: Payer: Self-pay | Admitting: *Deleted

## 2018-09-09 ENCOUNTER — Other Ambulatory Visit: Payer: Self-pay

## 2018-09-09 ENCOUNTER — Emergency Department
Admission: EM | Admit: 2018-09-09 | Discharge: 2018-09-09 | Disposition: A | Discharge status: Home / Self Care | Payer: 59 | Attending: Emergency Medicine | Admitting: Emergency Medicine

## 2018-09-09 DIAGNOSIS — Z79899 Other long term (current) drug therapy: Secondary | ICD-10-CM | POA: Diagnosis not present

## 2018-09-09 DIAGNOSIS — J020 Streptococcal pharyngitis: Secondary | ICD-10-CM | POA: Insufficient documentation

## 2018-09-09 DIAGNOSIS — Z87891 Personal history of nicotine dependence: Secondary | ICD-10-CM | POA: Insufficient documentation

## 2018-09-09 DIAGNOSIS — J029 Acute pharyngitis, unspecified: Secondary | ICD-10-CM | POA: Diagnosis present

## 2018-09-09 MED ORDER — AMOXICILLIN 875 MG PO TABS: 875.0000 mg | ORAL_TABLET | Freq: Two times a day (BID) | ORAL | 0 refills | Status: DC

## 2018-09-09 MED ORDER — AMOXICILLIN 500 MG PO CAPS
1000.0000 mg | ORAL_CAPSULE | Freq: Once | ORAL | Status: AC
  Administered 2018-09-09: 1000 mg via ORAL
  Filled 2018-09-09: qty 2

## 2018-09-09 MED ORDER — MAGIC MOUTHWASH W/LIDOCAINE: 5.0000 mL | Freq: Four times a day (QID) | ORAL | 0 refills | Status: DC

## 2018-09-09 NOTE — ED Triage Notes (Signed)
Sorethroat and  Chills  Started this  Am   Getting  Worse  Now it hurts  To swallow  Has a  History of tonsillitis in past   Pt is sitting upright on ewxam table  No acute  Distress   Symptoms not releived by otc meds

## 2018-09-09 NOTE — ED Provider Notes (Signed)
[facility] Emergency Department Provider Note  ____________________________________________  Time seen: Approximately 8:34 PM  I have reviewed the triage vital signs and the nursing notes.   HISTORY  Chief Complaint Sore Throat    HPI Rose Moyer is a 22 y.o. female who presents the emergency department complaining of a sore throat.  Patient woke this morning with a sore throat that is progressively worsened.  Patient states it feels as she she is swallowing razor blades.  Patient denies any difficulty breathing or swallowing.  She reports a low-grade fever that is started this afternoon.  Patient is concerned that she may have strep throat.  No nasal congestion, ear pain, coughing, shortness of breath, abdominal pain, nausea or vomiting, diarrhea or constipation.  Patient is tried numbing spray and cough lozenges with no relief of symptoms.    History reviewed. No pertinent past medical history.  Patient Active Problem List   Diagnosis Date Noted  . Breech presentation 01/06/2017  . Postpartum hemorrhage of cesarean section wound 01/06/2017    Past Surgical History:  Procedure Laterality Date  . CESAREAN SECTION N/A 01/06/2017   Procedure: CESAREAN SECTION;  Surgeon: Essie HartWalda Pinn, MD;  [facility];  Service: Obstetrics;  Laterality: N/A;    Prior to Admission medications   Medication Sig Start Date End Date Taking? Authorizing Provider  amoxicillin (AMOXIL) 875 MG tablet Take 1 tablet (875 mg total) by mouth 2 (two) times daily. 09/09/18   Erhard Senske, Delorise RoyalsJonathan D, PA-C  amoxicillin-clavulanate (AUGMENTIN) 875-125 MG tablet Take 1 tablet by mouth 2 (two) times daily. 01/03/18   Bing NeighborsHarris, Kimberly S, FNP  benzonatate (TESSALON) 100 MG capsule Take 1-2 capsules (100-200 mg total) by mouth 3 (three) times daily as needed for cough. 12/21/17   Bing NeighborsHarris, Kimberly S, FNP  cephALEXin (KEFLEX) 500 MG capsule Take 1 capsule (500 mg total) by mouth 2  (two) times daily. 06/24/18   Withrow, Everardo AllJohn C, FNP  docusate sodium (COLACE) 100 MG capsule Take 1 capsule (100 mg total) by mouth 2 (two) times daily. Patient not taking: Reported on 12/21/2017 01/10/17   Waynard Reedsoss, Kendra, MD  ferrous sulfate (FE TABS) 325 (65 FE) MG EC tablet Take 1 tablet (325 mg total) by mouth 3 (three) times daily with meals. Patient not taking: Reported on 12/21/2017 01/10/17   Waynard Reedsoss, Kendra, MD  ibuprofen (ADVIL,MOTRIN) 600 MG tablet Take 1 tablet (600 mg total) by mouth every 6 (six) hours as needed. Patient not taking: Reported on 12/21/2017 01/10/17   Waynard Reedsoss, Kendra, MD  magic mouthwash w/lidocaine SOLN Take 5 mLs by mouth 4 (four) times daily. 09/09/18   Idella Lamontagne, Delorise RoyalsJonathan D, PA-C  naproxen (NAPROSYN) 500 MG tablet Take 1 tablet (500 mg total) by mouth 2 (two) times daily with a meal. 12/21/17   Bing NeighborsHarris, Kimberly S, FNP  norethindrone-ethinyl estradiol-iron (LARIN FE 1.5/30) 1.5-30 MG-MCG tablet Take 1 tablet by mouth daily.    [provider]  oxyCODONE-acetaminophen (ROXICET) 5-325 MG tablet Take 1-2 tablets by mouth every 4 (four) hours as needed for severe pain. Patient not taking: Reported on 12/21/2017 01/10/17   Waynard Reedsoss, Kendra, MD    Allergies Patient has no known allergies.  No family history on file.  Social History Social History   Tobacco Use  . Smoking status: Former Smoker    Last attempt to quit: 03/07/2016    Years since quitting: 2.5  . Smokeless tobacco: Never Used  Substance Use Topics  . Alcohol use: Yes  Alcohol/week: 1.0 standard drinks    Types: 1 Cans of beer per week    Comment: DRANK YEARS  AGO   . Drug use: No     Review of Systems  Constitutional: Positive fever/chills Eyes: No visual changes. No discharge ENT: Positive for sore throat Cardiovascular: no chest pain. Respiratory: no cough. No SOB. Gastrointestinal: No abdominal pain.  No nausea, no vomiting.   Musculoskeletal: Negative for musculoskeletal pain. Skin: Negative  for rash, abrasions, lacerations, ecchymosis. Neurological: Negative for headaches, focal weakness or numbness. 10-point ROS otherwise negative.  ____________________________________________   PHYSICAL EXAM:  VITAL SIGNS: ED Triage Vitals  Enc Vitals Group     BP 09/09/18 2024 118/76     Pulse Rate 09/09/18 2024 (!) 118     Resp --      Temp 09/09/18 2024 (!) 100.5 F (38.1 C)     Temp Source 09/09/18 2024 Oral     SpO2 09/09/18 2024 100 %     Weight 09/09/18 2027 180 lb (81.6 kg)     Height 09/09/18 2027 5\' 5"  (1.651 m)     Head Circumference --      Peak Flow --      Pain Score 09/09/18 2027 5     Pain Loc --      Pain Edu? --      Excl. in GC? --      Constitutional: Alert and oriented. Well appearing and in no acute distress. Eyes: Conjunctivae are normal. PERRL. EOMI. Head: Atraumatic. ENT:      Ears: EACs and TMs unremarkable bilaterally.      Nose: No congestion/rhinnorhea.      Mouth/Throat: Mucous membranes are moist.  Tonsils are erythematous, edematous bilaterally.  Exudates present bilaterally.  Uvula is midline. Neck: No stridor.  Neck is supple full range of motion Hematological/Lymphatic/Immunilogical: Diffuse, mobile, anterior cervical lymphadenopathy.  Tender to palpation of the left anterior cervical lymphadenopathy. Cardiovascular: Normal rate, regular rhythm. Normal S1 and S2.  Good peripheral circulation. Respiratory: Normal respiratory effort without tachypnea or retractions. Lungs CTAB. Good air entry to the bases with no decreased or absent breath sounds. Musculoskeletal: Full range of motion to all extremities. No gross deformities appreciated. Neurologic:  Normal speech and language. No gross focal neurologic deficits are appreciated.  Skin:  Skin is warm, dry and intact. No rash noted. Psychiatric: Mood and affect are normal. Speech and behavior are normal. Patient exhibits appropriate insight and  judgement.   ____________________________________________   LABS (all labs ordered are listed, but only abnormal results are displayed)  Labs Reviewed - No data to display ____________________________________________  EKG   ____________________________________________  RADIOLOGY   No results found.  ____________________________________________    PROCEDURES  Procedure(s) performed:    Procedures    Medications  amoxicillin (AMOXIL) capsule 1,000 mg (has no administration in time range)     ____________________________________________   INITIAL IMPRESSION / ASSESSMENT AND PLAN / ED COURSE  Pertinent labs & imaging results that were available during my care of the patient were reviewed by me and considered in my medical decision making (see chart for details).  Review of the  CSRS was performed in accordance of the [facility] prior to dispensing any controlled drugs.      Patient's diagnosis is consistent with strep pharyngitis.  Patient presents the emergency department with sore throat and fever.  No other symptoms.  Differential included URI, viral pharyngitis, strep, peritonsillar abscess.  Patient has findings consistent with strep and meets  4 out of 5 [facility] criteria with only exception being age under 22.  As such, patient is diagnosed with strep and provided prescription of amoxicillin, Magic mouthwash for symptom relief.  Follow-up with primary care if needed..Patient is given ED precautions to return to the ED for any worsening or new symptoms.     ____________________________________________  FINAL CLINICAL IMPRESSION(S) / ED DIAGNOSES  Final diagnoses:  Strep throat      NEW MEDICATIONS STARTED DURING THIS VISIT:  ED Discharge Orders         Ordered    amoxicillin (AMOXIL) 875 MG tablet  2 times daily     09/09/18 2034    magic mouthwash w/lidocaine SOLN  4 times daily    Note to Pharmacy:  Dispense in a 1/1/1 ratio. Use lidocaine,  diphenhydramine, prednisolone   09/09/18 2034              This chart was dictated using voice recognition software/Dragon. Despite best efforts to proofread, errors can occur which can change the meaning. Any change was purely unintentional.    Racheal Patches, PA-C 09/09/18 2037    Emily Filbert, MD 09/09/18 [phone]

## 2018-09-09 NOTE — ED Notes (Signed)
Pt has  sorethroat and  Chill  Started today

## 2018-09-09 NOTE — ED Notes (Signed)
According to provider pts throat appears red with some  Swelling

## 2018-09-11 MED FILL — LARIN FE 1.5-30 TABLET: 1.5-30 | 84 days supply | Qty: 84 | Fill #0

## 2018-10-23 ENCOUNTER — Other Ambulatory Visit: Payer: Self-pay

## 2018-10-23 ENCOUNTER — Emergency Department
Admission: EM | Admit: 2018-10-23 | Discharge: 2018-10-23 | Disposition: A | Discharge status: Home / Self Care | Payer: 59 | Attending: Emergency Medicine | Admitting: Emergency Medicine

## 2018-10-23 DIAGNOSIS — R509 Fever, unspecified: Secondary | ICD-10-CM | POA: Diagnosis present

## 2018-10-23 DIAGNOSIS — J020 Streptococcal pharyngitis: Secondary | ICD-10-CM | POA: Insufficient documentation

## 2018-10-23 DIAGNOSIS — Z79899 Other long term (current) drug therapy: Secondary | ICD-10-CM | POA: Insufficient documentation

## 2018-10-23 DIAGNOSIS — Z87891 Personal history of nicotine dependence: Secondary | ICD-10-CM | POA: Diagnosis not present

## 2018-10-23 LAB — GROUP A STREP BY PCR: Group A Strep by PCR: NOT DETECTED

## 2018-10-23 MED ORDER — DEXAMETHASONE SODIUM PHOSPHATE 10 MG/ML IJ SOLN
10.0000 mg | Freq: Once | INTRAMUSCULAR | Status: AC
  Administered 2018-10-23: 10 mg via INTRAMUSCULAR
  Filled 2018-10-23: qty 1

## 2018-10-23 MED ORDER — AMOXICILLIN 875 MG PO TABS: 875.0000 mg | ORAL_TABLET | Freq: Two times a day (BID) | ORAL | 0 refills | Status: AC

## 2018-10-23 MED ORDER — ACETAMINOPHEN 325 MG PO TABS
650.0000 mg | ORAL_TABLET | Freq: Once | ORAL | Status: AC | PRN
  Administered 2018-10-23: 650 mg via ORAL
  Filled 2018-10-23: qty 2

## 2018-10-23 NOTE — ED Triage Notes (Signed)
Sore throat on left side and fever x 1 week. No medications taken PTA. Pt alert and oriented X4, active, cooperative, pt in NAD. RR even and unlabored, color WNL.

## 2018-10-23 NOTE — ED Notes (Signed)
Reference triage note. Pt c/o 5/10 pain in her throat. Pt in NAD at this time.

## 2018-10-23 NOTE — ED Provider Notes (Signed)
[facility] Emergency Department Provider Note  ____________________________________________  Time seen: Approximately 7:37 PM  I have reviewed the triage vital signs and the nursing notes.   HISTORY  Chief Complaint Fever and Sore Throat    HPI Rose Moyer is a 22 y.o. female presents to the emergency department with fever and pharyngitis for the past 2 days.  Patient has had no voice changes or dysphasia.  She was diagnosed with strep pharyngitis several weeks ago and reports that she took amoxicillin for about 3 days but then stopped medication when her symptoms improved.  Patient is tolerating fluids by mouth and her own secretions.  Patient reports that her symptoms feel like prior instances of strep pharyngitis.  No associated rhinorrhea, congestion, nonproductive cough or body aches.   History reviewed. No pertinent past medical history.  Patient Active Problem List   Diagnosis Date Noted  . Breech presentation 01/06/2017  . Postpartum hemorrhage of cesarean section wound 01/06/2017    Past Surgical History:  Procedure Laterality Date  . CESAREAN SECTION N/A 01/06/2017   Procedure: CESAREAN SECTION;  Surgeon: Essie HartWalda Pinn, MD;  [facility];  Service: Obstetrics;  Laterality: N/A;    Prior to Admission medications   Medication Sig Start Date End Date Taking? Authorizing Provider  amoxicillin (AMOXIL) 875 MG tablet Take 1 tablet (875 mg total) by mouth 2 (two) times daily for 10 days. 10/23/18 11/02/18  Orvil FeilWoods, Jaclyn M, PA-C  amoxicillin-clavulanate (AUGMENTIN) 875-125 MG tablet Take 1 tablet by mouth 2 (two) times daily. 01/03/18   Bing NeighborsHarris, Kimberly S, FNP  benzonatate (TESSALON) 100 MG capsule Take 1-2 capsules (100-200 mg total) by mouth 3 (three) times daily as needed for cough. 12/21/17   Bing NeighborsHarris, Kimberly S, FNP  cephALEXin (KEFLEX) 500 MG capsule Take 1 capsule (500 mg total) by mouth 2 (two) times daily. 06/24/18   Withrow, Everardo AllJohn C,  FNP  docusate sodium (COLACE) 100 MG capsule Take 1 capsule (100 mg total) by mouth 2 (two) times daily. Patient not taking: Reported on 12/21/2017 01/10/17   Waynard Reedsoss, Kendra, MD  ferrous sulfate (FE TABS) 325 (65 FE) MG EC tablet Take 1 tablet (325 mg total) by mouth 3 (three) times daily with meals. Patient not taking: Reported on 12/21/2017 01/10/17   Waynard Reedsoss, Kendra, MD  ibuprofen (ADVIL,MOTRIN) 600 MG tablet Take 1 tablet (600 mg total) by mouth every 6 (six) hours as needed. Patient not taking: Reported on 12/21/2017 01/10/17   Waynard Reedsoss, Kendra, MD  magic mouthwash w/lidocaine SOLN Take 5 mLs by mouth 4 (four) times daily. 09/09/18   Cuthriell, Delorise RoyalsJonathan D, PA-C  naproxen (NAPROSYN) 500 MG tablet Take 1 tablet (500 mg total) by mouth 2 (two) times daily with a meal. 12/21/17   Bing NeighborsHarris, Kimberly S, FNP  norethindrone-ethinyl estradiol-iron (LARIN FE 1.5/30) 1.5-30 MG-MCG tablet Take 1 tablet by mouth daily.    [provider]  oxyCODONE-acetaminophen (ROXICET) 5-325 MG tablet Take 1-2 tablets by mouth every 4 (four) hours as needed for severe pain. Patient not taking: Reported on 12/21/2017 01/10/17   Waynard Reedsoss, Kendra, MD    Allergies Patient has no known allergies.  No family history on file.  Social History Social History   Tobacco Use  . Smoking status: Former Smoker    Last attempt to quit: 03/07/2016    Years since quitting: 2.6  . Smokeless tobacco: Never Used  Substance Use Topics  . Alcohol use: Yes    Alcohol/week: 1.0 standard drinks  Types: 1 Cans of beer per week    Comment: DRANK YEARS  AGO   . Drug use: No     Review of Systems  Constitutional: Patient has fever.  Eyes: No visual changes. No discharge ENT: Patient has pharyngitis.  Cardiovascular: no chest pain. Respiratory: no cough. No SOB. Gastrointestinal: No abdominal pain.  No nausea, no vomiting.  No diarrhea.  No constipation. Musculoskeletal: Negative for musculoskeletal pain. Skin: Negative for rash,  abrasions, lacerations, ecchymosis. Neurological: Negative for headaches, focal weakness or numbness.  ____________________________________________   PHYSICAL EXAM:  VITAL SIGNS: ED Triage Vitals [10/23/18 1802]  Enc Vitals Group     BP 133/86     Pulse Rate (!) 137     Resp 18     Temp (!) 102.5 F (39.2 C)     Temp Source Oral     SpO2 100 %     Weight 180 lb (81.6 kg)     Height 5\' 5"  (1.651 m)     Head Circumference      Peak Flow      Pain Score 6     Pain Loc      Pain Edu?      Excl. in GC?      Constitutional: Alert and oriented. Well appearing and in no acute distress. Eyes: Conjunctivae are normal. PERRL. EOMI. Head: Atraumatic. ENT:      Ears: TMs are pearly.      Nose: No congestion/rhinnorhea.      Mouth/Throat: Mucous membranes are moist.  Posterior pharynx is erythematous with bilateral tonsillar exudate and hypertrophy. Neck: No stridor.  No cervical spine tenderness to palpation. Hematological/Lymphatic/Immunilogical: Palpable cervical lymphadenopathy.  Cardiovascular: Normal rate, regular rhythm. Normal S1 and S2.  Good peripheral circulation. Respiratory: Normal respiratory effort without tachypnea or retractions. Lungs CTAB. Good air entry to the bases with no decreased or absent breath sounds. Skin:  Skin is warm, dry and intact. No rash noted. Psychiatric: Mood and affect are normal. Speech and behavior are normal. Patient exhibits appropriate insight and judgement.   ____________________________________________   LABS (all labs ordered are listed, but only abnormal results are displayed)  Labs Reviewed  GROUP A STREP BY PCR   ____________________________________________  EKG   ____________________________________________  RADIOLOGY   No results found.  ____________________________________________    PROCEDURES  Procedure(s) performed:    Procedures    Medications  acetaminophen (TYLENOL) tablet 650 mg (650 mg Oral  Given 10/23/18 1805)  dexamethasone (DECADRON) injection 10 mg (10 mg Intramuscular Given 10/23/18 1856)     ____________________________________________   INITIAL IMPRESSION / ASSESSMENT AND PLAN / ED COURSE  Pertinent labs & imaging results that were available during my care of the patient were reviewed by me and considered in my medical decision making (see chart for details).  Review of the [place] CSRS was performed in accordance of the [facility] prior to dispensing any controlled drugs.  Assessment and plan Group A strep pharyngitis Patient presents to the emergency department with fever and pharyngitis for the past 2 days.  Group A strep testing was negative in the emergency department.  However, patient has a Mallampatti score of 4 and I doubt swabbing was successful.  Patient was treated with amoxicillin.  Patient was advised to take amoxicillin until completion.  Tylenol was recommended for fever.  All patient questions were answered.    ____________________________________________  FINAL CLINICAL IMPRESSION(S) / ED DIAGNOSES  Final diagnoses:  Strep throat  NEW MEDICATIONS STARTED DURING THIS VISIT:  ED Discharge Orders         Ordered    amoxicillin (AMOXIL) 875 MG tablet  2 times daily     10/23/18 1926              This chart was dictated using voice recognition software/Dragon. Despite best efforts to proofread, errors can occur which can change the meaning. Any change was purely unintentional.    Orvil FeilWoods, Jaclyn M, PA-C 10/23/18 1943    Jeanmarie PlantMcShane, James A, MD 10/23/18 2053

## 2018-11-24 ENCOUNTER — Ambulatory Visit: Payer: 59 | Admitting: Family Medicine

## 2018-12-08 MED FILL — LARIN FE 1.5-30 TABLET: 1.5-30 | 84 days supply | Qty: 84 | Fill #0

## 2019-01-04 ENCOUNTER — Emergency Department
Admission: EM | Admit: 2019-01-04 | Discharge: 2019-01-04 | Disposition: A | Discharge status: Home / Self Care | Payer: No Typology Code available for payment source | Attending: Emergency Medicine | Admitting: Emergency Medicine

## 2019-01-04 ENCOUNTER — Other Ambulatory Visit: Payer: Self-pay

## 2019-01-04 ENCOUNTER — Encounter: Payer: Self-pay | Admitting: *Deleted

## 2019-01-04 ENCOUNTER — Ambulatory Visit (INDEPENDENT_AMBULATORY_CARE_PROVIDER_SITE_OTHER): Payer: Self-pay | Admitting: Physician Assistant

## 2019-01-04 VITALS — BP 140/100 | HR 160 | Temp 101.6°F | Resp 20 | Wt 179.0 lb

## 2019-01-04 DIAGNOSIS — Z79899 Other long term (current) drug therapy: Secondary | ICD-10-CM | POA: Insufficient documentation

## 2019-01-04 DIAGNOSIS — R000 Tachycardia, unspecified: Secondary | ICD-10-CM

## 2019-01-04 DIAGNOSIS — J039 Acute tonsillitis, unspecified: Secondary | ICD-10-CM

## 2019-01-04 DIAGNOSIS — J029 Acute pharyngitis, unspecified: Secondary | ICD-10-CM | POA: Diagnosis present

## 2019-01-04 DIAGNOSIS — Z87891 Personal history of nicotine dependence: Secondary | ICD-10-CM | POA: Diagnosis not present

## 2019-01-04 LAB — BASIC METABOLIC PANEL
ANION GAP: 9 (ref 5–15)
BUN: 11 mg/dL (ref 6–20)
CALCIUM: 9 mg/dL (ref 8.9–10.3)
CO2: 23 mmol/L (ref 22–32)
Chloride: 108 mmol/L (ref 98–111)
Creatinine, Ser: 0.72 mg/dL (ref 0.44–1.00)
Glucose, Bld: 96 mg/dL (ref 70–99)
Potassium: 3.8 mmol/L (ref 3.5–5.1)
Sodium: 140 mmol/L (ref 135–145)

## 2019-01-04 LAB — CHLAMYDIA/NGC RT PCR (ARMC ONLY)
CHLAMYDIA TR: NOT DETECTED
N GONORRHOEAE: NOT DETECTED

## 2019-01-04 LAB — CBC
HCT: 41.7 % (ref 36.0–46.0)
Hemoglobin: 13.5 g/dL (ref 12.0–15.0)
MCH: 27.1 pg (ref 26.0–34.0)
MCHC: 32.4 g/dL (ref 30.0–36.0)
MCV: 83.6 fL (ref 80.0–100.0)
NRBC: 0 % (ref 0.0–0.2)
Platelets: 416 10*3/uL — ABNORMAL HIGH (ref 150–400)
RBC: 4.99 MIL/uL (ref 3.87–5.11)
RDW: 13.5 % (ref 11.5–15.5)
WBC: 13.7 10*3/uL — ABNORMAL HIGH (ref 4.0–10.5)

## 2019-01-04 LAB — POCT RAPID STREP A (OFFICE): RAPID STREP A SCREEN: NEGATIVE

## 2019-01-04 LAB — MONONUCLEOSIS SCREEN: Mono Screen: NEGATIVE

## 2019-01-04 LAB — TROPONIN I

## 2019-01-04 MED ORDER — PREDNISONE 1 MG PO TABS
60.0000 mg | ORAL_TABLET | Freq: Once | ORAL | Status: AC
  Administered 2019-01-04: 60 mg via ORAL

## 2019-01-04 MED ORDER — KETOROLAC TROMETHAMINE 30 MG/ML IJ SOLN
30.0000 mg | Freq: Once | INTRAMUSCULAR | Status: AC
  Administered 2019-01-04: 30 mg via INTRAVENOUS
  Filled 2019-01-04: qty 1

## 2019-01-04 MED ORDER — ACETAMINOPHEN 325 MG PO TABS
650.0000 mg | ORAL_TABLET | Freq: Once | ORAL | Status: AC
  Administered 2019-01-04: 650 mg via ORAL

## 2019-01-04 MED ORDER — SODIUM CHLORIDE 0.9 % IV BOLUS
1000.0000 mL | Freq: Once | INTRAVENOUS | Status: AC
  Administered 2019-01-04: 1000 mL via INTRAVENOUS

## 2019-01-04 MED ORDER — IBUPROFEN 600 MG PO TABS: 600.0000 mg | ORAL_TABLET | Freq: Four times a day (QID) | ORAL | 0 refills | Status: DC | PRN

## 2019-01-04 MED ORDER — PREDNISONE 20 MG PO TABS: 60.0000 mg | ORAL_TABLET | Freq: Once | ORAL | 0 refills | Status: DC

## 2019-01-04 NOTE — ED Notes (Signed)
Repeat throat culture collected and sent to lab in a red top culture collection kit.

## 2019-01-04 NOTE — ED Notes (Signed)
Sent green and purple tubes to lab. 

## 2019-01-04 NOTE — ED Triage Notes (Addendum)
Pt reports a sore throat for 1 day.  No cough.  Pt sent from urgent care.  Pt had tylenol and prednisone po.  No chest pain or sob.  Heart rate elevated  Pt alert.

## 2019-01-04 NOTE — Discharge Instructions (Addendum)
You can continue to take ibuprofen or Tylenol as needed for throat pain or fever.  We will call you if any of the cultures of the throat are positive and if you need to start an antibiotic.  Return to the ER for new or worsening throat pain, any shortness of breath, difficulty swallowing, or any other new or worsening symptoms that concern you.

## 2019-01-04 NOTE — Patient Instructions (Signed)
Thank you for choosing [facility] for your health care needs.  You have been diagnosed with tonsillitis.  Your rapid strep test was negative. [facility] cannot perform throat cultures.  You were given 650mg  Tylenol and 60mg  of prednisone PO.  Concern for your rapid heart rate of 160bpm.  Recommend further evaluation and treatment at the ED. Go directly to the [facility] ED for further treatment.  Hope you feel better soon!  Tonsillitis  Tonsillitis is an infection of the throat. This infection causes the tonsils to become red, tender, and swollen. Tonsils are tissues in the back of your throat. If bacteria caused your infection, antibiotic medicine will be given to you. Sometimes, symptoms of this infection can be treated with the use of medicines that lessen swelling (steroids). If your tonsillitis is very bad (severe) and happens often, you may need to get your tonsils removed (tonsillectomy). Follow these instructions at home: Medicines  Take over-the-counter and prescription medicines only as told by your doctor.  If you were prescribed an antibiotic, take it as told by your doctor. Do not stop taking the antibiotic even if you start to feel better. Eating and drinking  Drink enough fluid to keep your pee (urine) clear or pale yellow.  While your throat is sore, eat soft or liquid foods like: ? Soup. ? Sherbert. ? Instant breakfast drinks.  Drink warm fluids.  Eat frozen ice pops. General instructions  Rest as much as possible and get plenty of sleep.  Gargle with a salt-water mixture 3-4 times a day or as needed. To make a salt-water mixture, completely dissolve -1 tsp of salt in 1 cup of warm water.  Wash your hands often with soap and water. If there is no soap and water, use hand sanitizer.  Do not share cups, bottles, or other utensils until your symptoms are gone.  Do not smoke. If you need help quitting, ask your doctor.  Keep all  follow-up visits as told by your doctor. This is important. Contact a doctor if:  You have large, tender lumps in your neck.  You have a fever that does not go away after 2-3 days.  You have a rash.  You cough up green, yellow-brown, or bloody fluid.  You cannot swallow liquids or food for 24 hours.  Only one of your tonsils is swollen. Get help right away if:  You have any new symptoms such as: ? Vomiting. ? Very bad headache. ? Stiff neck. ? Chest pain. ? Trouble breathing or swallowing.  You have very bad throat pain and you also have drooling or voice changes.  You have very bad pain that is not helped by medicine.  You cannot fully open your mouth.  You have redness, swelling, or severe pain anywhere in your neck. Summary  Tonsillitis causes your tonsils to be red, tender, and swollen.  While your throat is sore, eat soft or liquid foods.  Gargle with a salt-water mixture 3-4 times a day or as needed.  Do not share cups, bottles, or other utensils until your symptoms are gone. This information is not intended to replace advice given to you by your health care provider. Make sure you discuss any questions you have with your health care provider. Document Released: 03/29/2008 Document Revised: 11/16/2016 Document Reviewed: 11/16/2016 Elsevier Interactive Patient Education  2019 [employer].

## 2019-01-04 NOTE — Progress Notes (Addendum)
Patient ID: Rose Moyer DOB: Aug 10, 1996 AGE: 23 y.o. MRN: [id number]   PCP: System, Pcp Not In   Chief Complaint:  Chief Complaint  Patient presents with  . Sore Throat     a.m     Subjective:    HPI:  Rose Moyer is a 23 y.o. female presents for evaluation  Chief Complaint  Patient presents with  . Sore Throat     a.m    23 year old female presents to [facility] with one day history of sore throat. Began mid day, mild. Gradually worsened. Feels swollen. Aggravated with swallowing. Equal bilaterally. Denies difficulty swallowing; able to swallow both liquids and food. Associated fever (did not take her temperature), headache, sweats/chills, and lack of appetite. Has not taken any OTC medication for symptom relief. Denies dizziness/lightheadedness, ear pain, neck pain, sinus pain, nasal congestion, rhinorrhea, sneezing, cough, chest pain, SOB, wheezing, nausea/vomiting, diarrhea, abdominal pain, rash.  Patient presented to [facility] on 09/09/2018 with sore throat. Associated fever. 4/5 Centor criteria. Diagonsed with strep; no testing performed. Prescribed Amoxicillin.  Patient seen at the [facility] ED again on 10/23/2018 with sore throat. Associated fever. Admitted to only taking 3 days worth of 7-day prescription of antibiotic. Rapid strep test was negative (however, there were doubts in successfulness of swabbing). Patient given an injection of decadron and prescribed amoxicillin.  Patient states she completed that antibiotic course. Patient reports frequent history of tonsillitis. Denies evaluation by ENT. Patient does admit to sexual intercourse, including oral. In an on&off relationship with the father of her child. Last tested for STIs when her son was born, 01/06/2017.  A limited review of symptoms was performed, pertinent positives and negatives as mentioned in HPI.  The following portions of the patient's history were reviewed and updated as appropriate:  allergies, current medications and past medical history.  Patient Active Problem List   Diagnosis Date Noted  . Breech presentation 01/06/2017  . Postpartum hemorrhage of cesarean section wound 01/06/2017    No Known Allergies  Current Outpatient Medications on File Prior to Visit  Medication Sig Dispense Refill  . norethindrone-ethinyl estradiol-iron (LARIN FE 1.5/30) 1.5-30 MG-MCG tablet Take 1 tablet by mouth daily.     No current facility-administered medications on file prior to visit.        Objective:   Vitals:   01/04/19 1637  BP: (!) 140/100  Pulse: (!) 160  Resp: 20  Temp: (!) 101.6 F (38.7 C)  SpO2: 98%     Wt Readings from Last 3 Encounters:  01/04/19 179 lb (81.2 kg)  10/23/18 180 lb (81.6 kg)  09/09/18 180 lb (81.6 kg)    Physical Exam:   General Appearance:  Patient sitting comfortably on examination table. Conversational. Peri Jefferson self-historian. In no acute distress. 101.20F temperature.  Patient with tachycardia.   Head:  Normocephalic, without obvious abnormality, atraumatic  Eyes:  PERRL, conjunctiva/corneas clear, EOM's intact  Ears:  Left ear canal WNL. No erythema or edema. No open wound. No visible purulent drainage. No tenderness with palpation over left tragus or with manipulation of left auricle. No visible erythema or edema of left mastoid. No tenderness with palpation over left mastoid. Right ear canal WNL. No erythema or edema. No open wound. No visible purulent drainage. No tenderness with palpation over right tragus or with manipulation of right auricle. No visible erythema or edema of right mastoid. No tenderness with palpation over right mastoid. Left TM WNL. Good light reflex. Visible landmarks. No erythema.  No injection. No bulging or retraction. No visible perforation. No serous effusion. No visible purulent effusion. No tympanostomy tube. No scar tissue. Right TM WNL. Good light reflex. Visible landmarks. No erythema. No injection. No  bulging or retraction. No visible perforation. No serous effusion. No visible purulent effusion. No tympanostomy tube. No scar tissue.  Nose: Nares normal. Septum midline. No visible polyps. No discharge. Normal mucosa. No sinus tenderness with percussion/palpation.  Throat: Lips, mucosa, and tongue normal; teeth and gums normal. Floor of mouth soft. Throat reveals no erythema. No postnasal drip. No visible cobblestoning. Tonsils reveal bilateral enlargement. 3/4. Equal enlargement. Not kissing/touching. Diffuse erythema. No exudate. No hot potato/muffled voice. No stridor. No drooling. No trismus. Uvula midline with no edema or erythema.  Neck: Supple, symmetrical, trachea midline, no lymphadenopathy  Lungs:   Clear to auscultation bilaterally, respirations unlabored. Good aeration. No rales, rhonchi, crackles or wheezing.  Heart:  Tachycardic; 160bpm. No arrhythmia noted; though difficult to tell at current rate. S1 and S2 normal, no murmur, rub, or gallop  Abdomen:   Normal to inspection. Normoactive bowel sounds. No tenderness with palpation. No guarding, rigidity or rebound tenderness. No palpable organomegaly.  Extremities: Extremities normal, atraumatic, no cyanosis or edema  Pulses: 2+ and symmetric  Skin: Skin color, texture, turgor normal, no rashes or lesions  Lymph nodes: Cervical, supraclavicular, and axillary nodes normal  Neurologic: Normal    Assessment & Plan:    Exam findings, diagnosis etiology and medication use and indications reviewed with patient. Follow-Up and discharge instructions provided. No emergent/urgent issues found on exam.  Patient education was provided.   Patient verbalized understanding of information provided and agrees with plan of care (POC), all questions answered. The patient is advised to call or return to clinic if condition does not see an improvement in symptoms, or to seek the care of the closest emergency department if condition worsens with the  below plan.    1. Tonsillitis  - acetaminophen (TYLENOL) tablet 650 mg - predniSONE (DELTASONE) tablet 60 mg - POCT rapid strep A  2. Tachycardia   Patient given 60mg  of prednisone PO and 650mg  of Tylenol PO.  Patient with third episode of tonsillitis in 4 months. Associated fever and tachycardia (160bpm). Negative rapid strep test. First episode of tonsillitis diagnosed as strep on Centor criteria. Second episode of tonsillitis poor swab, suspected false negative, diagnosed with strep. Both times prescribed Amoxicillin. Believe patient needs a throat culture. May also benefit from IV fluids (given heart rate), EBV titer, gonorrhea testing, etc.  Advised patient go directly to the ED for further evaluation and treatment. Offered to call courtesy vehicle or 911; patient declined, her boyfriend is in the parking lot and will drive her to the ED.  Called [facility] ED. Gave report to charge nurse.   Janalyn Harder, MHS, PA-C Rulon Sera, MHS, PA-C Advanced Practice Provider [facility]  [address], [facility], [address] [place], [place] [address] (p):  [phone] [email or web address]

## 2019-01-04 NOTE — ED Notes (Signed)
First Nurse Note; pt sent over via POV from [facility] due to recurrent tonsilitis x 3 w/ negative strep.  Per PA, pt febrile with HR of 160.

## 2019-01-04 NOTE — ED Provider Notes (Signed)
[facility] Emergency Department Provider Note ____________________________________________   First MD Initiated Contact with Patient 01/04/19 1827     (approximate)  I have reviewed the triage vital signs and the nursing notes.   HISTORY  Chief Complaint Sore Throat    HPI Rose Moyer is a 23 y.o. female with PMH as noted below who presents with sore throat, acute onset today, associated with pain on swallowing but no difficulty swallowing.  It is also associated with fever.  She denies any shortness of breath.  The patient was seen at urgent care and sent to the ED for further evaluation and because of tachycardia.  No past medical history on file.  Patient Active Problem List   Diagnosis Date Noted  . Breech presentation 01/06/2017  . Postpartum hemorrhage of cesarean section wound 01/06/2017    Past Surgical History:  Procedure Laterality Date  . CESAREAN SECTION N/A 01/06/2017   Procedure: CESAREAN SECTION;  Surgeon: Essie Hart, MD;  Location: [facility] BIRTHING SUITES;  Service: Obstetrics;  Laterality: N/A;    Prior to Admission medications   Medication Sig Start Date End Date Taking? Authorizing Provider  ibuprofen (ADVIL,MOTRIN) 600 MG tablet Take 1 tablet (600 mg total) by mouth every 6 (six) hours as needed. 01/04/19   Dionne Bucy, MD  norethindrone-ethinyl estradiol-iron (LARIN FE 1.5/30) 1.5-30 MG-MCG tablet Take 1 tablet by mouth daily.    [provider]    Allergies Patient has no known allergies.  No family history on file.  Social History Social History   Tobacco Use  . Smoking status: Former Smoker    Last attempt to quit: 03/07/2016    Years since quitting: 2.8  . Smokeless tobacco: Never Used  Substance Use Topics  . Alcohol use: Yes    Alcohol/week: 1.0 standard drinks    Types: 1 Cans of beer per week    Comment: DRANK YEARS  AGO   . Drug use: No    Review of Systems  Constitutional: Positive  for fever. Eyes: No redness. ENT: Positive for sore throat. Cardiovascular: Denies chest pain. Respiratory: Denies shortness of breath. Gastrointestinal: No vomiting or diarrhea.  Genitourinary: Negative for dysuria.  Musculoskeletal: Negative for back pain. Skin: Negative for rash. Neurological: Negative for headache.   ____________________________________________   PHYSICAL EXAM:  VITAL SIGNS: ED Triage Vitals  Enc Vitals Group     BP 01/04/19 1736 (!) 137/98     Pulse Rate 01/04/19 1736 (!) 153     Resp 01/04/19 1736 16     Temp 01/04/19 1736 (!) 101.2 F (38.4 C)     Temp Source 01/04/19 1736 Oral     SpO2 01/04/19 1736 98 %     Weight 01/04/19 1737 179 lb (81.2 kg)     Height 01/04/19 1737 5\' 5"  (1.651 m)     Head Circumference --      Peak Flow --      Pain Score 01/04/19 1740 5     Pain Loc --      Pain Edu? --      Excl. in GC? --     Constitutional: Alert and oriented. Well appearing and in no acute distress. Eyes: Conjunctivae are normal.  Head: Atraumatic. Nose: No congestion/rhinnorhea. Mouth/Throat: Mucous membranes are moist.  Tonsils swollen and erythematous with no significant exudate. Neck: Normal range of motion.  No lymphadenopathy. Cardiovascular: Tachycardic, regular rhythm. Grossly normal heart sounds.  Good peripheral circulation. Respiratory: Normal respiratory effort.  No  retractions. Lungs CTAB. Gastrointestinal: No distention.  Musculoskeletal:Extremities warm and well perfused.  Neurologic:  Normal speech and language. No gross focal neurologic deficits are appreciated.  Skin:  Skin is warm and dry. No rash noted. Psychiatric: Mood and affect are normal. Speech and behavior are normal.  ____________________________________________   LABS (all labs ordered are listed, but only abnormal results are displayed)  Labs Reviewed  CBC - Abnormal; Notable for the following components:      Result Value   WBC 13.7 (*)    Platelets 416 (*)     All other components within normal limits  CHLAMYDIA/NGC RT PCR ([facility] ONLY)  BASIC METABOLIC PANEL  TROPONIN I  MONONUCLEOSIS SCREEN   ____________________________________________  EKG  ED ECG REPORT I, Dionne Bucy, the attending physician, personally viewed and interpreted this ECG.  Date: 01/04/2019 EKG Time: 1745 Rate: 141 Rhythm: Sinus tachycardia QRS Axis: normal Intervals: normal ST/T Wave abnormalities: normal Narrative Interpretation: no evidence of acute ischemia  ____________________________________________  RADIOLOGY    ____________________________________________   PROCEDURES  Procedure(s) performed: No  Procedures  Critical Care performed: No ____________________________________________   INITIAL IMPRESSION / ASSESSMENT AND PLAN / ED COURSE  Pertinent labs & imaging results that were available during my care of the patient were reviewed by me and considered in my medical decision making (see chart for details).  23 year old female with PMH as noted above presents with sore throat that started this morning and is associated with fever but no other significant symptoms.  I reviewed the past medical records in epic.  The patient was seen in acute care this afternoon and referred to the emergency department due to tachycardia, as well as the fact that the patient has had several episodes of pharyngitis over the last few months.  The patient had a negative rapid strep test today and was given Tylenol and prednisone.  On exam, the patient is very well-appearing.  She is tachycardic (around 125 when I examined her) with a fever, but otherwise normal vital signs.  Oropharynx shows swelling to her tonsils but there are no pooled secretions, stridor, or any evidence of airway obstruction.  There is no evidence of peritonsillar or retropharyngeal abscess.  Overall presentation is consistent with pharyngitis.  The patient's tachycardia is not  too worrisome in the context of a young and healthy patient with a fever and her EKG shows sinus tachycardia.  We will give fluids, Toradol for the fever, and obtain throat culture, mono screen, GC/CT as per the plan from acute care.  If the tachycardia improves appropriately, anticipate discharge home.  ----------------------------------------- 8:20 PM on 01/04/2019 -----------------------------------------  On my reassessment, the patient's heart rate was around 101-105.  She continues to appear comfortable and states her throat feels better.  Mono screen is negative.  Strep culture and GC/CT are pending.  At this time based on the negative strep test and the patient's clinical presentation, I discussed with the patient about treating empirically for possible strep versus expectant management and treating only if the culture is positive.  She prefers the latter, so we will not give an antibiotic at this time.  I counseled her on return precautions and she expressed understanding.  ____________________________________________   FINAL CLINICAL IMPRESSION(S) / ED DIAGNOSES  Final diagnoses:  Pharyngitis, unspecified etiology      NEW MEDICATIONS STARTED DURING THIS VISIT:  New Prescriptions   IBUPROFEN (ADVIL,MOTRIN) 600 MG TABLET    Take 1 tablet (600 mg total) by mouth every 6 (six)  hours as needed.     Note:  This document was prepared using Dragon voice recognition software and may include unintentional dictation errors.   Dionne Bucy, MD 01/04/19 2022

## 2019-01-04 NOTE — ED Notes (Signed)
MD at bedside. 

## 2019-01-06 LAB — CULTURE, GROUP A STREP (THRC)

## 2019-03-01 MED FILL — LARIN FE 1.5-30 TABLET: 1.5-30 | 84 days supply | Qty: 84 | Fill #0

## 2019-05-03 MED FILL — VALACYCLOVIR HCL 500 MG TAB: 500 | 30 days supply | Qty: 30 | Fill #0

## 2019-05-23 MED FILL — LARIN FE 1.5-30 TABLET: 1.5-30 | 84 days supply | Qty: 84 | Fill #0

## 2019-06-11 MED FILL — VALACYCLOVIR HCL 500 MG TAB: 500 | 30 days supply | Qty: 30 | Fill #1

## 2019-08-18 MED FILL — LARIN FE 1.5-30 TABLET: 1.5-30 | 28 days supply | Qty: 28 | Fill #1

## 2019-09-17 MED FILL — LARIN FE 1.5-30 TABLET: 1.5-30 | 28 days supply | Qty: 28 | Fill #2

## 2019-09-18 MED FILL — VALACYCLOVIR HCL 500 MG TAB: 500 | 30 days supply | Qty: 30 | Fill #2

## 2019-10-15 MED FILL — LARIN FE 1.5-30 TABLET: 1.5-30 | 28 days supply | Qty: 28 | Fill #3

## 2019-11-09 MED FILL — LARIN FE 1.5-30 TABLET: 1.5-30 | 28 days supply | Qty: 28 | Fill #4

## 2019-11-26 ENCOUNTER — Other Ambulatory Visit: Payer: Self-pay

## 2019-11-26 ENCOUNTER — Encounter (HOSPITAL_COMMUNITY): Payer: Self-pay

## 2019-11-26 ENCOUNTER — Emergency Department (HOSPITAL_COMMUNITY)
Admission: EM | Admit: 2019-11-26 | Discharge: 2019-11-26 | Disposition: A | Discharge status: Home / Self Care | Payer: BC Managed Care – PPO | Attending: Emergency Medicine | Admitting: Emergency Medicine

## 2019-11-26 DIAGNOSIS — Z793 Long term (current) use of hormonal contraceptives: Secondary | ICD-10-CM | POA: Insufficient documentation

## 2019-11-26 DIAGNOSIS — M545 Low back pain: Secondary | ICD-10-CM | POA: Insufficient documentation

## 2019-11-26 DIAGNOSIS — Y92410 Unspecified street and highway as the place of occurrence of the external cause: Secondary | ICD-10-CM | POA: Insufficient documentation

## 2019-11-26 DIAGNOSIS — Y999 Unspecified external cause status: Secondary | ICD-10-CM | POA: Diagnosis not present

## 2019-11-26 DIAGNOSIS — R519 Headache, unspecified: Secondary | ICD-10-CM | POA: Insufficient documentation

## 2019-11-26 DIAGNOSIS — Z87891 Personal history of nicotine dependence: Secondary | ICD-10-CM | POA: Diagnosis not present

## 2019-11-26 DIAGNOSIS — Y93I9 Activity, other involving external motion: Secondary | ICD-10-CM | POA: Insufficient documentation

## 2019-11-26 MED ORDER — METHOCARBAMOL 500 MG PO TABS: 500.0000 mg | ORAL_TABLET | Freq: Three times a day (TID) | ORAL | 0 refills | Status: AC | PRN

## 2019-11-26 MED ORDER — NAPROXEN 500 MG PO TABS: 500.0000 mg | ORAL_TABLET | Freq: Two times a day (BID) | ORAL | 0 refills | Status: AC
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
## 2019-11-26 NOTE — ED Notes (Signed)
Discharge instructions reviewed. Patient verbalizes understanding. Patient calling for a ride and will call out when ready to leave.
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
